# Patient Record
Sex: Female | Born: 1975 | Race: White | Hispanic: No | Smoking: Never smoker
Health system: Southern US, Community
[De-identification: ages and names within clinical notes are randomized; demographics above are authoritative.]

## PROBLEM LIST (undated history)

## (undated) DIAGNOSIS — M1990 Unspecified osteoarthritis, unspecified site: Secondary | ICD-10-CM

## (undated) DIAGNOSIS — Z87442 Personal history of urinary calculi: Secondary | ICD-10-CM

## (undated) DIAGNOSIS — N649 Disorder of breast, unspecified: Secondary | ICD-10-CM

## (undated) DIAGNOSIS — R202 Paresthesia of skin: Secondary | ICD-10-CM

## (undated) DIAGNOSIS — N200 Calculus of kidney: Secondary | ICD-10-CM

## (undated) DIAGNOSIS — R200 Anesthesia of skin: Secondary | ICD-10-CM

## (undated) DIAGNOSIS — C801 Malignant (primary) neoplasm, unspecified: Secondary | ICD-10-CM

## (undated) HISTORY — PX: ABDOMINAL HYSTERECTOMY: SHX81

## (undated) HISTORY — DX: Paresthesia of skin: R20.2

## (undated) HISTORY — DX: Anesthesia of skin: R20.0

## (undated) HISTORY — PX: SPINE SURGERY: SHX786

## (undated) HISTORY — PX: KNEE ARTHROSCOPY: SUR90

## (undated) HISTORY — PX: CHOLECYSTECTOMY: SHX55

## (undated) HISTORY — PX: TUBAL LIGATION: SHX77

---

## 1999-08-16 ENCOUNTER — Other Ambulatory Visit: Admission: RE | Admit: 1999-08-16 | Discharge: 1999-08-16 | Payer: Self-pay | Admitting: Gynecology

## 2000-08-28 ENCOUNTER — Other Ambulatory Visit: Admission: RE | Admit: 2000-08-28 | Discharge: 2000-08-28 | Payer: Self-pay | Admitting: Gynecology

## 2000-10-26 ENCOUNTER — Emergency Department (HOSPITAL_COMMUNITY): Admission: EM | Admit: 2000-10-26 | Discharge: 2000-10-26 | Payer: Self-pay | Admitting: Emergency Medicine

## 2000-10-29 ENCOUNTER — Inpatient Hospital Stay (HOSPITAL_COMMUNITY): Admission: EM | Admit: 2000-10-29 | Discharge: 2000-11-01 | Payer: Self-pay | Admitting: Internal Medicine

## 2000-10-29 ENCOUNTER — Encounter: Payer: Self-pay | Admitting: Internal Medicine

## 2001-08-12 ENCOUNTER — Other Ambulatory Visit: Admission: RE | Admit: 2001-08-12 | Discharge: 2001-08-12 | Payer: Self-pay | Admitting: Gynecology

## 2002-08-20 ENCOUNTER — Other Ambulatory Visit: Admission: RE | Admit: 2002-08-20 | Discharge: 2002-08-20 | Payer: Self-pay | Admitting: Gynecology

## 2002-09-02 ENCOUNTER — Encounter: Admission: RE | Admit: 2002-09-02 | Discharge: 2002-09-02 | Payer: Self-pay | Admitting: Internal Medicine

## 2002-09-02 ENCOUNTER — Encounter: Payer: Self-pay | Admitting: Internal Medicine

## 2002-09-09 ENCOUNTER — Emergency Department (HOSPITAL_COMMUNITY): Admission: EM | Admit: 2002-09-09 | Discharge: 2002-09-09 | Payer: Self-pay | Admitting: Emergency Medicine

## 2002-09-12 ENCOUNTER — Encounter: Payer: Self-pay | Admitting: General Surgery

## 2002-09-12 ENCOUNTER — Ambulatory Visit (HOSPITAL_COMMUNITY): Admission: RE | Admit: 2002-09-12 | Discharge: 2002-09-13 | Payer: Self-pay | Admitting: General Surgery

## 2002-09-12 ENCOUNTER — Encounter (INDEPENDENT_AMBULATORY_CARE_PROVIDER_SITE_OTHER): Payer: Self-pay | Admitting: *Deleted

## 2003-07-02 ENCOUNTER — Ambulatory Visit (HOSPITAL_COMMUNITY): Admission: RE | Admit: 2003-07-02 | Discharge: 2003-07-02 | Payer: Self-pay | Admitting: Family Medicine

## 2003-07-03 ENCOUNTER — Ambulatory Visit: Admission: RE | Admit: 2003-07-03 | Discharge: 2003-07-03 | Payer: Self-pay | Admitting: Family Medicine

## 2004-02-24 ENCOUNTER — Other Ambulatory Visit: Admission: RE | Admit: 2004-02-24 | Discharge: 2004-02-24 | Payer: Self-pay | Admitting: Gynecology

## 2004-07-24 ENCOUNTER — Emergency Department (HOSPITAL_COMMUNITY): Admission: EM | Admit: 2004-07-24 | Discharge: 2004-07-24 | Payer: Self-pay | Admitting: Emergency Medicine

## 2004-11-03 ENCOUNTER — Ambulatory Visit (HOSPITAL_COMMUNITY): Admission: RE | Admit: 2004-11-03 | Discharge: 2004-11-04 | Payer: Self-pay | Admitting: Orthopedic Surgery

## 2005-10-21 ENCOUNTER — Emergency Department (HOSPITAL_COMMUNITY): Admission: EM | Admit: 2005-10-21 | Discharge: 2005-10-22 | Payer: Self-pay | Admitting: Emergency Medicine

## 2006-01-07 ENCOUNTER — Emergency Department (HOSPITAL_COMMUNITY): Admission: EM | Admit: 2006-01-07 | Discharge: 2006-01-07 | Payer: Self-pay | Admitting: Emergency Medicine

## 2006-02-21 ENCOUNTER — Other Ambulatory Visit: Admission: RE | Admit: 2006-02-21 | Discharge: 2006-02-21 | Payer: Self-pay | Admitting: Gynecology

## 2006-04-06 ENCOUNTER — Ambulatory Visit (HOSPITAL_BASED_OUTPATIENT_CLINIC_OR_DEPARTMENT_OTHER): Admission: RE | Admit: 2006-04-06 | Discharge: 2006-04-06 | Payer: Self-pay | Admitting: Orthopedic Surgery

## 2007-05-19 ENCOUNTER — Emergency Department (HOSPITAL_COMMUNITY): Admission: EM | Admit: 2007-05-19 | Discharge: 2007-05-19 | Payer: Self-pay | Admitting: Emergency Medicine

## 2007-07-30 ENCOUNTER — Emergency Department (HOSPITAL_COMMUNITY): Admission: EM | Admit: 2007-07-30 | Discharge: 2007-07-30 | Payer: Self-pay | Admitting: Emergency Medicine

## 2008-01-01 ENCOUNTER — Other Ambulatory Visit: Admission: RE | Admit: 2008-01-01 | Discharge: 2008-01-01 | Payer: Self-pay | Admitting: Gynecology

## 2008-01-07 ENCOUNTER — Inpatient Hospital Stay (HOSPITAL_COMMUNITY): Admission: AD | Admit: 2008-01-07 | Discharge: 2008-01-07 | Payer: Self-pay | Admitting: Gynecology

## 2008-12-14 ENCOUNTER — Emergency Department (HOSPITAL_COMMUNITY): Admission: EM | Admit: 2008-12-14 | Discharge: 2008-12-14 | Payer: Self-pay | Admitting: Emergency Medicine

## 2010-04-12 LAB — URINALYSIS, ROUTINE W REFLEX MICROSCOPIC
Ketones, ur: NEGATIVE mg/dL
Nitrite: POSITIVE — AB
Protein, ur: 30 mg/dL — AB
Specific Gravity, Urine: 1.025 (ref 1.005–1.030)
Urobilinogen, UA: >8 mg/dL — ABNORMAL HIGH (ref 0.0–1.0)

## 2010-04-12 LAB — URINE MICROSCOPIC-ADD ON

## 2010-04-12 LAB — URINE CULTURE: Colony Count: >=100000

## 2010-05-09 ENCOUNTER — Emergency Department (HOSPITAL_COMMUNITY): Payer: 59

## 2010-05-09 ENCOUNTER — Emergency Department (HOSPITAL_COMMUNITY)
Admission: EM | Admit: 2010-05-09 | Discharge: 2010-05-09 | Disposition: A | Discharge status: Home / Self Care | Payer: 59 | Attending: Emergency Medicine | Admitting: Emergency Medicine

## 2010-05-09 DIAGNOSIS — B9689 Other specified bacterial agents as the cause of diseases classified elsewhere: Secondary | ICD-10-CM | POA: Insufficient documentation

## 2010-05-09 DIAGNOSIS — A499 Bacterial infection, unspecified: Secondary | ICD-10-CM | POA: Insufficient documentation

## 2010-05-09 DIAGNOSIS — N72 Inflammatory disease of cervix uteri: Secondary | ICD-10-CM | POA: Insufficient documentation

## 2010-05-09 DIAGNOSIS — N760 Acute vaginitis: Secondary | ICD-10-CM | POA: Insufficient documentation

## 2010-05-09 DIAGNOSIS — N390 Urinary tract infection, site not specified: Secondary | ICD-10-CM | POA: Insufficient documentation

## 2010-05-09 DIAGNOSIS — R1031 Right lower quadrant pain: Secondary | ICD-10-CM | POA: Insufficient documentation

## 2010-05-09 DIAGNOSIS — Z9089 Acquired absence of other organs: Secondary | ICD-10-CM | POA: Insufficient documentation

## 2010-05-09 LAB — DIFFERENTIAL
Basophils Absolute: 0 10*3/uL (ref 0.0–0.1)
Eosinophils Relative: 4 % (ref 0–5)
Lymphocytes Relative: 25 % (ref 12–46)
Monocytes Absolute: 0.5 10*3/uL (ref 0.1–1.0)
Neutro Abs: 6.8 10*3/uL (ref 1.7–7.7)

## 2010-05-09 LAB — CBC
HCT: 39 % (ref 36.0–46.0)
MCHC: 35.4 g/dL (ref 30.0–36.0)
MCV: 88.2 fL (ref 78.0–100.0)
RBC: 4.42 MIL/uL (ref 3.87–5.11)
RDW: 12.9 % (ref 11.5–15.5)

## 2010-05-09 LAB — COMPREHENSIVE METABOLIC PANEL
Albumin: 4.4 g/dL (ref 3.5–5.2)
Alkaline Phosphatase: 93 U/L (ref 39–117)
Calcium: 9.7 mg/dL (ref 8.4–10.5)
Chloride: 106 mEq/L (ref 96–112)
GFR calc Af Amer: >60 mL/min (ref >60)
GFR calc non Af Amer: >60 mL/min (ref >60)
Glucose, Bld: 89 mg/dL (ref 70–99)
Potassium: 3.5 mEq/L (ref 3.5–5.1)
Sodium: 139 mEq/L (ref 135–145)
Total Protein: 7.8 g/dL (ref 6.0–8.3)

## 2010-05-09 LAB — URINALYSIS, ROUTINE W REFLEX MICROSCOPIC
Nitrite: NEGATIVE
Specific Gravity, Urine: 1.027 (ref 1.005–1.030)
Urobilinogen, UA: 0.2 mg/dL (ref 0.0–1.0)

## 2010-05-09 LAB — PREGNANCY, URINE: Preg Test, Ur: NEGATIVE

## 2010-05-09 LAB — URINE MICROSCOPIC-ADD ON

## 2010-05-09 MED ORDER — IOHEXOL 300 MG/ML  SOLN
100.0000 mL | Freq: Once | INTRAMUSCULAR | Status: AC | PRN
  Administered 2010-05-09: 100 mL via INTRAVENOUS

## 2010-05-10 LAB — GC/CHLAMYDIA PROBE AMP, GENITAL: GC Probe Amp, Genital: NEGATIVE

## 2010-05-10 LAB — URINE CULTURE: Culture  Setup Time: 201205010126

## 2010-05-27 NOTE — Discharge Summary (Signed)
[facility]  Patient:    Claudia Mcintyre, Claudia Mcintyre Visit Number: [id number] MRN: [id number]          Service Type: MED Location: 3W 0357 02 Attending Physician:  Wilson Singer Dictated by:   Lilly Cove, M.D. Admit Date:  10/29/2000 Discharge Date: 11/01/2000                             Discharge Summary  FINAL DISCHARGE DIAGNOSES: 1. Right upper quadrant abdominal pain of unclear etiology. 2. Probable viral gastroenteritis.  CONDITION ON DISCHARGE:  Stable.  HISTORY OF PRESENT ILLNESS:  This 35 year old patient of mine was admitted with right upper quadrant abdominal pain associated with nausea and vomiting. Please see initial history and physical examination for initial evaluation.  HOSPITAL COURSE:  The patient had a HIDA scan while in the hospital and this was normal.  She was seen by the surgeon, Dr. Cyndia Bent, who felt that this was not a surgical cause of her pain and advised gastroenterology to see. Dr. Roosvelt Harps, gastroenterologist, saw the patient and he did upper GI endoscopy.  The upper GI endoscopy revealed mild gastritis but no other problems.  A CLOtest was negative.  During this time, she was given intravenous fluids and analgesics as well as antiemetics as needed.  She improved just with conservative treatment and was able to be discharged home on November 01, 2000, with no other additional recommendations.  She will call my office if she gets further problems. Dictated by:   Lilly Cove, M.D. Attending Physician:  Wilson Singer DD:  11/07/00 TD:  11/08/00 Job: [id number]

## 2010-05-27 NOTE — Op Note (Signed)
NAMEGRACELEE, STEMMLER           ACCOUNT NO.:  [id number]   MEDICAL RECORD NO.:  [id number]          PATIENT TYPE:  AMB   LOCATION:  DSC                          FACILITY:  [facility]   PHYSICIAN:  Deidre Ala, M.D.    DATE OF BIRTH:  02-10-1975   DATE OF PROCEDURE:  04/06/2006  DATE OF DISCHARGE:                               OPERATIVE REPORT   PREOPERATIVE DIAGNOSIS:  Locked right knee, rule out internal  derangement.   POSTOPERATIVE DIAGNOSES:  1. 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  2. Tight lateral retinaculum.  3. 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   PROCEDURES:  1. Right knee arthritis arthroscopy with medial lateral plica      excisions.  2. Ablation chondroplasty medial femoral condyle.  3. Lateral right retinacular release.   SURGEON:  1. Charlesetta Shanks, M.D.   ASSISTANT:  None.   ANESTHESIA:  General with LMA.   CULTURES:  None.   DRAINS:  None.   ESTIMATED BLOOD LOSS:  Minimal.   TOURNIQUET TIME:  22 minutes.   PATHOLOGIC FINDINGS AND HISTORY:  Claudia Mcintyre is a 35 year old female who  came in with an acute locked knee following a popping sound in her knee  with a twist.  Two years ago she had a similar scenario that was  refractory to conservative management and injections and therapy and  ultimately she was scoped with plica excisions and a lateral release.  This was exactly the same as before.  A great deal of pain in the  lateral gutter and inability to flex the knee.  At surgery we found huge  medial lateral plicas, a tight lateral retinaculum and my assumption is  that she subluxed her knee, hence the pop, she started pinching on the  lateral plica and this was causing her pain.  She absolutely would not  bend her knee.  The menisci were intact, both the ACL was intact.  There  was an area rubbing of the medial femoral condyle of plica,  chondromalacia grade II to III which we smoothed with the ablator on  one  as well as some trochlea and inferior pole patella changes.  There was  pouch synovitis.  There were no loose bodies.   PROCEDURE:  With adequate anesthesia obtained using LMA technique, 1  gram Ancef given IV prophylaxis, the patient was placed in the supine  position.  The right lower extremity was prepped from the malleoli to  the leg holder in the standard fashion.  After standard prepping and  draping, Esmarch examination was used the tourniquet was let up to 350  mmHg.  Superior lateral inflow portal was made, the knee was insufflated  with normal saline with the arthroscopic pump.  Medial and lateral scope  portals were then made and the joint was thoroughly inspected.  I then  shaved the medial plica back to the sidewall and lysed the fibrous  medial band.  I then cauterized bleeding points.  I then used the  ablator to smooth the medial femoral condyle defect.  I then checked and  probed the medial meniscus.  The  ACL and the lateral meniscus.  Portals  reversed and I shaved out the large lateral plica and observed tilt and  track as well as the softness of the medial patellar facette.  Pouch  synovitis was removed.  I then did an arthroscopic lateral retinacular  release with the ArthroCare hook from the vastus lateralis to the joint  line cauterizing geniculate vessels.  Improved tilt and track was noted.  The knee was irrigated through the scope, 0.5% Marcaine with morphine  was injected in and about the portals and the joint.  The portals left  open.  A bulky sterile compressive dressing was applied with lateral  foam pad for tamponade and easy wrap placed.  The patient then having  procedure well was awakened, taken to recovery room in satisfactory  condition to be discharged per outpatient routine, given Vicodin for  pain and told to call the office for appointment for recheck tomorrow.           ______________________________  V. Charlesetta Shanks, M.D.     VEP/MEDQ   D:  04/06/2006  T:  04/06/2006  Job:  [id number]

## 2010-05-27 NOTE — H&P (Signed)
[facility]  Patient:    Claudia Mcintyre, Claudia Mcintyre Visit Number: [id number] MRN: [id number]          Service Type: MED Location: 3W 0357 02 Attending Physician:  Wilson Singer Dictated by:   Lilly Cove, M.D. Admit Date:  10/29/2000                           History and Physical  HISTORY OF PRESENT ILLNESS:  This is a 35 year old patient of mine who has had right upper quadrant abdominal pain associated with nausea and vomiting and I have seen her in the office and ordered an ultrasound of the abdomen.  The ultrasound showed a contracted gallbladder and suggestion was made to do a HIDA scan.  Since this time she has had continued nausea and vomiting with the abdominal pain.  She has been to the emergency room twice.  She has not had much food intake.  ALLERGIES:  No known drug allergies.  MEDICATIONS:  No regular medications.  SOCIAL HISTORY:  She is a nonsmoker, does not have a history of alcohol abuse. She works at [employer].  FAMILY HISTORY:  Noncontributory.  REVIEW OF SYSTEMS:  Apart from the symptoms above, there are no other symptoms referable to the HEENT, constitutional, respiratory, cardiovascular, neurological, musculoskeletal, genitourinary systems.  PHYSICAL EXAMINATION:  VITAL SIGNS:  She is afebrile.  GENERAL:  She is hemodynamically stable.  She is not jaundiced.  CARDIOVASCULAR:  Heart sounds are present and normal with no murmurs or _________ .  LUNGS:  Lung fields are clear.  ABDOMEN:  Shows tenderness in the right upper quadrant.  The abdomen is soft and does not have rebound tenderness.  NEUROLOGIC:  She is alert and oriented with no focal neurologic signs.  INVESTIGATIONS:  White cell count is normal.  Alkaline phosphatase also is normal.  BUN and creatinine are also normal.  IMPRESSION/PLAN:  Right upper quadrant abdominal pain.  There seems to be clear gallbladder pathology and she is going to have a  HIDA scan.  Also we will get surgical consultations for further evaluation.  Further recommendations will depend on patients progress and will give intravenous fluids, keep her n.p.o., and give her intravenous antiemetics. Dictated by:   Lilly Cove, M.D. Attending Physician:  Wilson Singer DD:  10/29/00 TD:  10/29/00 Job: [id number]

## 2010-05-27 NOTE — Discharge Summary (Signed)
[name], [name] NO.:  [id number]   MEDICAL RECORD NO.:  [id number]          PATIENT TYPE:  INP   LOCATION:  5023                         FACILITY:  [facility]   PHYSICIAN:  Nelda Severe, MD      DATE OF BIRTH:  1975/10/19   DATE OF ADMISSION:  11/03/2004  DATE OF DISCHARGE:  11/04/2004                                 DISCHARGE SUMMARY   HOSPITAL COURSE:  This 35 year old woman was admitted for management of left  sciatic pain secondary to lateral stenosis.  She was taken the operating  room where a left-sided L4-5 lateral foraminotomy was carried out with  decompression of both L4-L5 nerve roots.  There were no complications.  This  morning, the morning after surgery, she has no complaints of leg pain.  She  has noticed a difference in her leg when she has been up to the bathroom.  Her dressing is dry.  She has good strength in both lower extremities on  manual resistance testing.   DISCHARGE MEDICATIONS:  At the present time, I have given her a prescription  for Vicodin (5/500) one to two q.6 h. p.r.n., 50 tablets, and she already  has some of this medication at home.   DISCHARGE INSTRUCTIONS:  She has been instructed to avoid bending and  lifting.  She may take a shower.  She will discontinue and dressing in 2  days' time.  Her physical therapy postoperatively is walking.  She is to  come see me in the office in 3 week's time.  She is to call me if there is a  problem with wound drainage or persistent fever.   FINAL DIAGNOSIS:  Lumbar spondylosis and stenosis, L4-5.   CONDITION ON DISCHARGE:  Ambulatory, wound stable, improved left leg pain.   FOLLOWUP ARRANGEMENTS AND INSTRUCTIONS:  See above.      Nelda Severe, MD  Electronically Signed     MT/MEDQ  D:  11/04/2004  T:  11/04/2004  Job:  [id number]

## 2010-05-27 NOTE — Op Note (Signed)
Claudia Mcintyre, Claudia Mcintyre                       ACCOUNT NO.:  [id number]   MEDICAL RECORD NO.:  [id number]                   PATIENT TYPE:  OIB   LOCATION:  5703                                 FACILITY:  [facility]   PHYSICIAN:  Gabrielle Dare. Janee Morn, M.D.             DATE OF BIRTH:  1975-01-31   DATE OF PROCEDURE:  09/12/2002  DATE OF DISCHARGE:                                 OPERATIVE REPORT   PREOPERATIVE DIAGNOSIS:  Symptomatic cholelithiasis.   POSTOPERATIVE DIAGNOSIS:  Symptomatic cholelithiasis.   PROCEDURE:  Laparoscopic cholecystectomy with intraoperative cholangiogram.   SURGEON:  Gabrielle Dare. Janee Morn, M.D.   ASSISTANT:  Jimmye Norman, M.D.   ANESTHESIA:  General.   HISTORY OF PRESENT ILLNESS:  The patient is a 35 year old white female who  was worked up for an acute-onset attack of right upper quadrant pain by her  primary care doctor, Wilson Singer, M.D.  Ultrasound demonstrated  cholelithiasis without acute cholecystitis.  The patient was scheduled for  elective procedure.  She had some more frequent attacks of biliary colic,  and her case was moved up sooner due to her symptoms.  She presents now for  elective cholecystectomy.   PROCEDURE IN DETAIL:  Informed consent was procured from the patient.  The  patient received preoperative antibiotics.  She was brought to the operating  room and general anesthesia was administered.  Her abdomen was prepped and  draped in a sterile fashion.  A curvilinear infraumbilical incision was  made, subcutaneous tissues were dissected down, revealing the anterior  fascia, which was divided sharply.  The peritoneal cavity was then entered  under direct vision without difficulty and a 0 Vicryl pursestring suture was  placed around the fascial opening.  The Hasson trocar was then inserted into  the abdomen and the abdomen was insufflated with carbon dioxide in the  standard fashion.  Under direct vision an 11 mm epigastric and two 5 mm   lateral ports were placed.  The dome of the gallbladder was retracted  superomedially.  Several omental adhesions were taken down using the Bovie  cautery revealing the infundibulum, which was retracted superior and  laterally.  Dissection was begun laterally and progressed medially, easily  revealing the cystic duct.  This was circumferentially dissected, creating a  nice large window between the cystic duct, the infundibulum of the  gallbladder, and the liver.  Once this was accomplished with satisfactory  visualization, a clip was placed on the infundibulum-cystic duct junction.  A small nick was made in the cystic duct and a Reddick cholangiogram  catheter was inserted.  Intraoperative cholangiogram was taken, which  demonstrated no filling defects in the common bile duct and a decent length  of cystic duct remaining.  The cholangiogram catheter was removed.  Three  clips were placed proximally on the cystic duct and it was divided.  Further  dissection revealed the cystic artery, which was encircled.  This was  clipped twice proximally, once distally, and divided.  The gallbladder was  then taken off the liver bed with careful further dissection with Bovie  cautery.  No other arterial branches were noted.  The cautery was used to  get good hemostasis on the liver bed and the gallbladder was placed in an  EndoCatch bag and taken out of the abdomen via the umbilical port site.  Once this was accomplished the abdomen was completely irrigated.  The liver  bed was rechecked and noted to be completely hemostatic and the irrigation  fluid was removed, and it was clear.  The ports were removed under direct  vision and the pneumoperitoneum was released.  The Hasson trocar was  removed.  The umbilical fascia was closed by tying a 0 Vicryl pursestring,  and all four wounds were copiously irrigated and closed with a running 4-0  Vicryl subcuticular stitch.  Marcaine 0.25% with epinephrine had been  used  at all port sites for local anesthetic.  Sponge, needle, and instrument  counts were correct.  Benzoin and Steri-Strips and sterile dressings were  applied.  The patient tolerated the procedure well without any apparent  complication and was taken to the recovery room in stable condition.                                               Gabrielle Dare Janee Morn, M.D.    BET/MEDQ  D:  09/12/2002  T:  09/13/2002  Job:  [id number]

## 2010-05-27 NOTE — Op Note (Signed)
NAMEPAISELY, BRICK NO.:  [id number]   MEDICAL RECORD NO.:  [id number]          PATIENT TYPE:  INP   LOCATION:  2899                         FACILITY:  [facility]   PHYSICIAN:  Nelda Severe, MD      DATE OF BIRTH:  1975-03-01   DATE OF PROCEDURE:  11/03/2004  DATE OF DISCHARGE:                                 OPERATIVE REPORT   SURGEON:  Nelda Severe, MD   ANESTHESIOLOGIST:  Odis Hollingshead, RN, PA-C   PREOPERATIVE DIAGNOSIS:  L4-5 lateral stenosis.   POSTOPERATIVE DIAGNOSIS:  L4-5 lateral stenosis.   OPERATIVE PROCEDURE:  1.  L4-5 lateral foraminotomy.  2.  Decompression of left-sided L4 and L5 nerve roots.   INDICATIONS FOR SURGERY:  Left leg pain.   DESCRIPTION OF PROCEDURE:  The patient was placed under general endotracheal  anesthesia.  A Foley catheter was placed in the bladder.  She was positioned  prone on the [place] spinal frame using the slings to support her legs so  that her lumbar spine would flexed.  Care was taken to position the upper  extremities so as to avoid hyperflexion and abduction of the shoulders,  hyperflexion of the elbows and any pressure whatsoever on the cubital  tunnels was avoided by use of foam padding from axilla to hands.   The lumbar area was prepped with DuraPrep and draped in rectangular fashion.  The drapes were secured with Ioban.   A short midline incision was made over what was perceived to be the L4-5  level.  The subcutaneous tissue and paraspinal fascia were injected with a  mixture of 0.25% Marcaine with epinephrine and 1% lidocaine with  epinephrine.  The paraspinal muscles were mobilized to the left side using  cutting current and Cobb elevators.  A cross-table lateral radiograph was  taken with the Kocher placed on what was initially perceived to be the L4  spinous process, which was proved to be the L5 spinous process.  The L4 and  L5 spinous process were extremely close together.  Another x-ray was  taken  with a Kocher on each of L4 and L5 spinous processes, confirming the level.   After the muscle had been mobilized to the lateral border of the facet  joint, a Taylor retractor was placed lateral to the apophyseal joint at L4-  5.   We then separated the ligamentum flavum from the medial edge of the facet  joint and the overhanging lamina above.  A high-speed bur was then used to  perform a medial facetectomy and to remove lamina proximally.  Ultimately,  the laminotomy was enlarged using small Kerrison rongeurs.  The ligamentum  flavum was detached and removed to expose the origins of the L5 nerve root.  The epidural veins extremely congested and bleeding was controlled with  bipolar coagulation.  Laminotomy was extended proximally and also a  foraminotomy at L4-5 carried out, removing with Kerrison rongeurs the upper  tip of the superior articular process.  A ball-tip probe could be admitted  to the L4-5 neural foramen and passed easily.  The L5 root was decompressed  to  the midpoint of the L5 pedicle and was free from pressure.   There was a small amount of epidural bleeding which could not be controlled  with bipolar coagulation.  Thrombin-soaked Gelfoam was placed in the  laminotomy for approximately 5 minutes and then removed, and there was no  more bleeding.  A small amount of bone bleeding was controlled with bone  wax.   We then injected more of the above-mentioned mixture of local into the  paraspinal fascia and also lateral to superior articular process of S1 and  L5 in order to attempt to anesthetize the medial branch the posterior  primary ramus.   The wound was then closed in layers using interrupted figure-of-eight #1  Vicryl suture in the fascia, 2-0 Vicryl suture in interrupted fashion in the  subcutaneous tissue and a subcuticular 3-0 undyed Vicryl for the skin.  The  skin incision was reinforced with Steri-Strips.  An antibiotic ointment  dressing was  applied and secured with OpSite.   The patient was then transferred to her bed.  She has not awakened  sufficiently to perform a neurologic examination, so none is recorded here.      Nelda Severe, MD  Electronically Signed     MT/MEDQ  D:  11/03/2004  T:  11/03/2004  Job:  [id number]

## 2010-05-27 NOTE — Consult Note (Signed)
[facility]  Patient:    Claudia Mcintyre, Claudia Mcintyre Visit Number: [id number] MRN: [id number]          Service Type: MED Location: 3W 0357 02 Attending Physician:  Wilson Singer Dictated by:   Currie Paris, M.D. Proc. Date: 10/29/00 Admit Date:  10/29/2000   CC:         Claudia Mcintyre, M.D.   Consultation Report  CHIEF COMPLAINT:  Right upper quadrant pain.  CLINICAL HISTORY:  I was asked by Dr. Karilyn Cota to see Claudia Mcintyre because of right upper quadrant pain. The patient relates that her pain began 4 or 5 days ago. It has been associated with nausea and intermittent vomiting as well as some diarrhea that lasted 3-4 days. She has felt warm but has not actually recorded a fever. She apparently has been in the emergency room twice and then was seen by Dr. Karilyn Cota and admitted for further evaluation today. She has had no relief of her symptoms but the pain primarily lists in the right upper quadrant. The patient has never had anything similar to this before.  ALLERGIES:  No known drug allergies.  MEDICATIONS:  None.  PAST SURGICAL HISTORY:  Tubal ligation.  SOCIAL HISTORY:  Smokes. No alcohol.  PHYSICAL EXAMINATION:  GENERAL:  The patient is a healthy, slightly uncomfortable female.  VITAL SIGNS:  Stable on admission.  HEENT:  Normocephalic. Eyes nonicteric. Pupils are equal, round, and reactive to light.  NECK:  Supple. No masses or thyromegaly.  LUNGS:  Clear to auscultation.  CARDIOVASCULAR:  Regular. No murmur, rub or gallop.  ABDOMEN:  Soft, but tender in the right upper quadrant, although not at all distended. There is an umbilical ring present. Bowel sounds are normal.  PELVIC: Not done.  EXTREMITIES:  No clubbing, cyanosis, or edema.  LABORATORY DATA:  Basically unremarkable. The white count and liver functions are normal. Her pulmonary ultrasound report in the chart on a written note is showing contracting gallbladder but  I do not have the full report. She just had a hepatobiliary scan which shows filling of the gallbladder and a 68% ejection fraction which is normal.  IMPRESSION:  Right upper quadrant pain of uncertain etiology, unlikely to be acute cholecystitis given the negative hepatobiliary scan and good ejection fraction. She does not really have a "surgical" abdomen.  RECOMMENDATION:  I think perhaps continued workup perhaps with a gastrointestinal consult for an endoscopy or even a colonoscopy since she has a fair amount of diarrhea as well, and we will continue to follow her. Dictated by:   Currie Paris, M.D. Attending Physician:  Wilson Singer DD:  10/29/00 TD:  10/30/00 Job: [id number]

## 2010-05-27 NOTE — Consult Note (Signed)
[facility]  Patient:    Claudia Mcintyre, Claudia Mcintyre Visit Number: [id number] MRN: [id number]          Service Type: MED Location: 3W [id number] Attending Physician:  Wilson Singer Dictated by:   Roosvelt Harps, M.D. Proc. Date: 10/30/00 Admit Date:  10/29/2000   CC:         Lilly Cove, M.D.   Consultation Report  DATE OF BIRTH:  [birth year]  REFERRING PHYSICIAN:  Dr. Lilly Cove.  REASON FOR CONSULTATION:  Claudia Mcintyre is a 35 year old female whom I am asked to see for a several-day episode of nausea, vomiting, right upper quadrant and epigastric pain associated with diarrhea.  She has not had any hematemesis or melena.  Her diarrhea recently has been watery-brown.  Epigastric pain has persisted.  She had an ultrasound done that showed a contracted gallbladder and for this reason, a HIDA scan was ordered, which was done with meal stimulation, and was found to be normal.  In hospital, she has been treated with IV fluids and promethazine but still complains of nausea and epigastric pain.  She apparently has had episodic alcohol overuse but denies any recently.  She denies dysphagia, weight loss or chronic gastrointestinal problems similar to the current situation.  PAST MEDICAL HISTORY:  Fairly unremarkable.  CURRENT MEDICATIONS:  None.  ALLERGIES:  None.  FAMILY HISTORY:  Noncontributory.  No family history of ulcers, gallstones, inflammatory bowel disease or gastrointestinal neoplasia.  SOCIAL HISTORY:  Nonsmoker.  History of episodic alcohol overuse.  Single.  REVIEW OF SYSTEMS:  GENERAL:  No weight loss or night sweats.  ENDOCRINE:  No history of diabetes or thyroid problems.  SKIN:  No rash or pruritus.  EYES: No icterus or change in vision.  ENT:  No aphthous ulcers or chronic sore throat.  RESPIRATORY:  No shortness of breath, cough or wheezing.  CARDIAC: No chest pain, palpitations or history of valvular heart disease.  GI:   As above.  GU:  No dysuria or hematuria.  Remainder of her review of systems is negative.  PHYSICAL EXAMINATION:  GENERAL:  On physical exam, she is a well-developed, mildly overweight young adult female in no acute distress, afebrile, with a blood pressure of 120/58, pulse is 66 and regular.  SKIN:  Normal.  HEENT:  Eyes are anicteric.  Oropharynx is unremarkable.  NECK:  Supple without thyromegaly or adenopathy.  NODES:  There is no cervical or inguinal adenopathy.  CHEST:  Clear.  HEART:  Heart sounds regular rate and rhythm.  ABDOMEN:  Soft with positive bowel sounds.  There is tenderness in the epigastrium and particularly in the right upper quadrant.  There is no mass or rebound.  RECTAL:  External hemorrhoidal tags but no internal lesions and minimal stool is guaiac negative.  EXTREMITIES:  Without cyanosis, clubbing, edema or rash.  LABORATORY TESTS:  Hemoglobin is 12.7, white blood count 5.6, platelets normal.  On liver function panel, her SGPT is 42 today but was normal on admission and all other liver function tests are normal.  Amylase and lipase are negative.  Urinalysis normal.  Blood cultures negative to date.  IMPRESSION:  [age]-year-old female who I suspect has a resolving gastroenteritis, however, peptic disease is a possibility.  PLAN:  Upper endoscopy will be performed to rule out significant peptic disease or gastritis.  Pending the results of this, if negative, she should just be observed on clear liquids.  The procedure of upper endoscopy is reviewed with the  patient in terms of technique, preparation and risks of complications, she agrees to proceed and it will be scheduled this afternoon. Please see the orders. Dictated by:   Roosvelt Harps, M.D. Attending Physician:  Wilson Singer DD:  10/30/00 TD:  10/31/00 Job: [id number]

## 2010-07-10 ENCOUNTER — Emergency Department (HOSPITAL_COMMUNITY)
Admission: EM | Admit: 2010-07-10 | Discharge: 2010-07-10 | Disposition: A | Discharge status: Home / Self Care | Payer: 59 | Attending: Emergency Medicine | Admitting: Emergency Medicine

## 2010-07-10 DIAGNOSIS — K089 Disorder of teeth and supporting structures, unspecified: Secondary | ICD-10-CM | POA: Insufficient documentation

## 2010-10-07 LAB — URINALYSIS, ROUTINE W REFLEX MICROSCOPIC: Specific Gravity, Urine: 1.025

## 2010-10-07 LAB — WET PREP, GENITAL
Trich, Wet Prep: NONE SEEN
Yeast Wet Prep HPF POC: NONE SEEN

## 2010-10-07 LAB — GC/CHLAMYDIA PROBE AMP, GENITAL: Chlamydia, DNA Probe: POSITIVE — AB

## 2010-10-07 LAB — DIFFERENTIAL
Basophils Absolute: 0
Basophils Relative: 1
Eosinophils Absolute: 0.1
Eosinophils Relative: 2
Monocytes Absolute: 0.4
Monocytes Relative: 5

## 2010-10-07 LAB — COMPREHENSIVE METABOLIC PANEL
ALT: 18
AST: 27
Albumin: 4.1
Alkaline Phosphatase: 76
BUN: 9
Chloride: 105
Potassium: 3.3 — ABNORMAL LOW
Sodium: 137
Total Bilirubin: 0.4
Total Protein: 7.3

## 2010-10-07 LAB — URINE MICROSCOPIC-ADD ON

## 2010-10-07 LAB — URINE CULTURE

## 2010-10-07 LAB — PREGNANCY, URINE: Preg Test, Ur: NEGATIVE

## 2010-10-07 LAB — CBC
HCT: 39
Platelets: 350
RDW: 15.4
WBC: 8.3

## 2011-04-29 ENCOUNTER — Emergency Department (HOSPITAL_COMMUNITY)
Admission: EM | Admit: 2011-04-29 | Discharge: 2011-04-29 | Disposition: A | Discharge status: Home / Self Care | Payer: 59 | Attending: Emergency Medicine | Admitting: Emergency Medicine

## 2011-04-29 ENCOUNTER — Encounter (HOSPITAL_COMMUNITY): Payer: Self-pay | Admitting: Emergency Medicine

## 2011-04-29 DIAGNOSIS — L0390 Cellulitis, unspecified: Secondary | ICD-10-CM

## 2011-04-29 DIAGNOSIS — R000 Tachycardia, unspecified: Secondary | ICD-10-CM | POA: Insufficient documentation

## 2011-04-29 DIAGNOSIS — L02818 Cutaneous abscess of other sites: Secondary | ICD-10-CM | POA: Insufficient documentation

## 2011-04-29 MED ORDER — IBUPROFEN 800 MG PO TABS: 800.0000 mg | ORAL_TABLET | Freq: Three times a day (TID) | ORAL | Status: AC

## 2011-04-29 MED ORDER — AMOXICILLIN-POT CLAVULANATE 875-125 MG PO TABS: 1.0000 | ORAL_TABLET | Freq: Two times a day (BID) | ORAL | Status: AC

## 2011-04-29 MED ORDER — CEFTRIAXONE SODIUM 1 G IJ SOLR
1.0000 g | Freq: Once | INTRAMUSCULAR | Status: AC
  Administered 2011-04-29: 1 g via INTRAMUSCULAR
  Filled 2011-04-29: qty 10

## 2011-04-29 MED ORDER — ONDANSETRON HCL 4 MG PO TABS
4.0000 mg | ORAL_TABLET | Freq: Once | ORAL | Status: AC
  Administered 2011-04-29: 4 mg via ORAL
  Filled 2011-04-29: qty 1

## 2011-04-29 MED ORDER — OXYCODONE-ACETAMINOPHEN 5-325 MG PO TABS: 1.0000 | ORAL_TABLET | ORAL | Status: AC | PRN

## 2011-04-29 MED ORDER — OXYCODONE-ACETAMINOPHEN 5-325 MG PO TABS
1.0000 | ORAL_TABLET | Freq: Once | ORAL | Status: AC
  Administered 2011-04-29: 1 via ORAL
  Filled 2011-04-29: qty 1

## 2011-04-29 MED ORDER — IBUPROFEN 800 MG PO TABS
800.0000 mg | ORAL_TABLET | Freq: Once | ORAL | Status: AC
  Administered 2011-04-29: 800 mg via ORAL
  Filled 2011-04-29: qty 1

## 2011-04-29 MED ORDER — DOXYCYCLINE HYCLATE 100 MG PO TABS
100.0000 mg | ORAL_TABLET | Freq: Once | ORAL | Status: AC
  Administered 2011-04-29: 100 mg via ORAL
  Filled 2011-04-29: qty 1

## 2011-04-29 MED ORDER — DOXYCYCLINE HYCLATE 100 MG PO CAPS: 100.0000 mg | ORAL_CAPSULE | Freq: Two times a day (BID) | ORAL | Status: AC

## 2011-04-29 NOTE — ED Provider Notes (Signed)
History     CSN: [id number]  Arrival date & time 04/29/11  1711   First MD Initiated Contact with Patient 04/29/11 1752      Chief Complaint  Patient presents with  . Cellulitis    (Consider location/radiation/quality/duration/timing/severity/associated sxs/prior treatment) HPI Comments: Pt states she has a small pimple behind the right ear a little over 1 week ago. She sleep with earrings in and the pimple was punctured. The wound has been getting progressively worse. She now has increase redness behind the right ear, extending to the lower angle of the jaw. No reported high fever, no n/v, and no chills. She does have some headache on the right side. The area is tender to touch. She has tried tylenol and motrin, and peroxide, but these are not helpful.   The history is provided by the patient.    History reviewed. No pertinent past medical history.  Past Surgical History  Procedure Date  . Cholecystectomy   . Tubal ligation     No family history on file.  History  Substance Use Topics  . Smoking status: Never Smoker   . Smokeless tobacco: Not on file  . Alcohol Use: No    OB History    Grav Para Term Preterm Abortions TAB SAB Ect Mult Living                  Review of Systems  Constitutional: Negative for activity change.       All ROS Neg except as noted in HPI  HENT: Negative for nosebleeds and neck pain.   Eyes: Negative for photophobia and discharge.  Respiratory: Negative for cough, shortness of breath and wheezing.   Cardiovascular: Negative for chest pain and palpitations.  Gastrointestinal: Negative for abdominal pain and blood in stool.  Genitourinary: Negative for dysuria, frequency and hematuria.  Musculoskeletal: Negative for back pain and arthralgias.  Skin: Positive for wound.  Neurological: Positive for headaches. Negative for dizziness, seizures and speech difficulty.  Psychiatric/Behavioral: Negative for hallucinations and confusion.     Allergies  Review of patient's allergies indicates no known allergies.  Home Medications  No current outpatient prescriptions on file.  BP 137/82  Pulse 112  Temp(Src) 98.4 F (36.9 C) (Oral)  Resp 18  Ht 5\' 3"  (1.6 m)  Wt 155 lb (70.308 kg)  BMI 27.46 kg/m2  SpO2 100%  LMP 04/21/2011  Physical Exam  Nursing note and vitals reviewed. Constitutional: She is oriented to person, place, and time. She appears well-developed and well-nourished.  Non-toxic appearance.  HENT:  Head: Normocephalic.  Right Ear: Tympanic membrane and external ear normal.  Left Ear: Tympanic membrane and external ear normal.       There is a red raised scabbed area behind the right ear. The area is tender to touch. The tenderness extends just below the angle of the right jaw. Increase redness of the site, but no streaking.  The TMs (right and left) are wnl. No ear drainage.  Eyes: EOM and lids are normal. Pupils are equal, round, and reactive to light.  Neck: Normal range of motion. Neck supple. Carotid bruit is not present. No tracheal deviation present.  Cardiovascular: Regular rhythm, normal heart sounds, intact distal pulses and normal pulses.  Tachycardia present.  Exam reveals no gallop and no friction rub.   No murmur heard. Pulmonary/Chest: Breath sounds normal. No respiratory distress.  Abdominal: Soft. Bowel sounds are normal. There is no tenderness. There is no guarding.  Musculoskeletal: Normal range of  motion.  Lymphadenopathy:       Head (right side): No submandibular adenopathy present.       Head (left side): No submandibular adenopathy present.    She has no cervical adenopathy.  Neurological: She is alert and oriented to person, place, and time. She has normal strength. No cranial nerve deficit or sensory deficit.  Skin: Skin is warm and dry.  Psychiatric: She has a normal mood and affect. Her speech is normal.    ED Course  Procedures (including critical care time)  Labs  Reviewed - No data to display No results found.   No diagnosis found.    MDM  I have reviewed nursing notes, vital signs, and all appropriate lab and imaging results for this patient. Pt has an abscess with early cellulitis behind the right ear. The pinna is not involved. The TM is wnl. No high fever, There is tachycardia. Pt is treated with Rocephin and Doxycycline today. Rx for Augmentin and Doxycycline given. Rx for ibuprofen and percocet given for pain and inflammation. Pt to return to the ED on 4/23 for recheck, sooner if any changes or problem.       Kathie Dike, [place] 04/29/11 [phone]

## 2011-04-29 NOTE — ED Notes (Signed)
Pt with ? abscess behind the right ear x 1 week

## 2011-04-29 NOTE — ED Notes (Signed)
Pt DC to home with steady gait 

## 2011-04-29 NOTE — Discharge Instructions (Signed)
Please apply warm compress behind the right ear for 10 to 15 min, three times daily. Augmentin and doxycycline 2 times daily with food until all taken. Ibuprofen three times daily after a meal. Percocet for pain if needed. This medication may cause drowsiness, use with caution. Please return on 4/23 for recheck of your infection. Return sooner if high fever or symptoms of worsening infection.Abscess An abscess (boil or furuncle) is an infected area under your skin. This area is filled with yellowish white fluid (pus). HOME CARE   Only take medicine as told by your doctor.   Keep the skin clean around your abscess. Keep clothes that may touch the abscess clean.   Change any bandages (dressings) as told by your doctor.   Avoid direct skin contact with other people. The infection can spread by skin contact with others.   Practice good hygiene and do not share personal care items.   Do not share athletic equipment, towels, or whirlpools. Shower after every practice or work out session.   If a draining area cannot be covered:   Do not play sports.   Children should not go to daycare until the wound has healed or until fluid (drainage) stops coming out of the wound.   See your doctor for a follow-up visit as told.  GET HELP RIGHT AWAY IF:   There is more pain, puffiness (swelling), and redness in the wound site.   There is fluid or bleeding from the wound site.   You have muscle aches, chills, fever, or feel sick.   You or your child has a temperature by mouth above 102 F (38.9 C), not controlled by medicine.   Your baby is older than 3 months with a rectal temperature of 102 F (38.9 C) or higher.  MAKE SURE YOU:   Understand these instructions.   Will watch your condition.   Will get help right away if you are not doing well or get worse.  Document Released: 06/14/2007 Document Revised: 12/15/2010 Document Reviewed: 06/14/2007 [facility] Patient Information 2012 [place],  [place].

## 2011-04-29 NOTE — ED Provider Notes (Signed)
Medical screening examination/treatment/procedure(s) were performed by non-physician practitioner and as supervising physician I was immediately available for consultation/collaboration.   Tafari Humiston L Jazyah Butsch, MD 04/29/11 2353 

## 2011-05-02 ENCOUNTER — Encounter (HOSPITAL_COMMUNITY): Payer: Self-pay | Admitting: *Deleted

## 2011-05-02 ENCOUNTER — Emergency Department (HOSPITAL_COMMUNITY)
Admission: EM | Admit: 2011-05-02 | Discharge: 2011-05-02 | Disposition: A | Discharge status: Home / Self Care | Payer: 59 | Attending: Emergency Medicine | Admitting: Emergency Medicine

## 2011-05-02 DIAGNOSIS — IMO0002 Reserved for concepts with insufficient information to code with codable children: Secondary | ICD-10-CM

## 2011-05-02 DIAGNOSIS — L02818 Cutaneous abscess of other sites: Secondary | ICD-10-CM | POA: Insufficient documentation

## 2011-05-02 DIAGNOSIS — L03818 Cellulitis of other sites: Secondary | ICD-10-CM | POA: Insufficient documentation

## 2011-05-02 MED ORDER — FLUCONAZOLE 150 MG PO TABS: 150.0000 mg | ORAL_TABLET | Freq: Once | ORAL | Status: AC

## 2011-05-02 NOTE — Discharge Instructions (Signed)
Abscess An abscess (boil or furuncle) is an infected area that contains a collection of pus.  SYMPTOMS Signs and symptoms of an abscess include pain, tenderness, redness, or hardness. You may feel a moveable soft area under your skin. An abscess can occur anywhere in the body.  TREATMENT  A surgical cut (incision) may be made over your abscess to drain the pus. Gauze may be packed into the space or a drain may be looped through the abscess cavity (pocket). This provides a drain that will allow the cavity to heal from the inside outwards. The abscess may be painful for a few days, but should feel much better if it was drained.  Your abscess, if seen early, may not have localized and may not have been drained. If not, another appointment may be required if it does not get better on its own or with medications. HOME CARE INSTRUCTIONS   Only take over-the-counter or prescription medicines for pain, discomfort, or fever as directed by your caregiver.   Take your antibiotics as directed if they were prescribed. Finish them even if you start to feel better.   Keep the skin and clothes clean around your abscess.   If the abscess was drained, you will need to use gauze dressing to collect any draining pus. Dressings will typically need to be changed 3 or more times a day.   The infection may spread by skin contact with others. Avoid skin contact as much as possible.   Practice good hygiene. This includes regular hand washing, cover any draining skin lesions, and do not share personal care items.   If you participate in sports, do not share athletic equipment, towels, whirlpools, or personal care items. Shower after every practice or tournament.   If a draining area cannot be adequately covered:   Do not participate in sports.   Children should not participate in day care until the wound has healed or drainage stops.   If your caregiver has given you a follow-up appointment, it is very important  to keep that appointment. Not keeping the appointment could result in a much worse infection, chronic or permanent injury, pain, and disability. If there is any problem keeping the appointment, you must call back to this facility for assistance.  SEEK MEDICAL CARE IF:   You develop increased pain, swelling, redness, drainage, or bleeding in the wound site.   You develop signs of generalized infection including muscle aches, chills, fever, or a general ill feeling.   You have an oral temperature above 102 F (38.9 C).  MAKE SURE YOU:   Understand these instructions.   Will watch your condition.   Will get help right away if you are not doing well or get worse.  Document Released: 10/05/2004 Document Revised: 12/15/2010 Document Reviewed: 07/30/2007 [facility] Patient Information 2012 [place], [place].  Finished both of your antibiotics as discussed.  Continue warm compresses.  You may try breaking your ibuprofen in half which may cause less stomach upset in nature you are taking this with food in her stomach.  You may take the Diflucan  prescription if needed for yeast vaginitis symptoms as discussed.  Your infection appears to be healing well.  Please get rechecked for any signs that this is trying to get worse again, but hopefully this will resolve completely without any difficulty.

## 2011-05-02 NOTE — ED Notes (Signed)
Pt here to get wound checked behind her right ear. Pt seen on Sunday.

## 2011-05-02 NOTE — ED Provider Notes (Signed)
History     CSN: [id number]  Arrival date & time 05/02/11  1744   First MD Initiated Contact with Patient 05/02/11 1757      Chief Complaint  Patient presents with  . Wound Check    (Consider location/radiation/quality/duration/timing/severity/associated sxs/prior treatment) HPI Comments: Claudia Mcintyre presents for a recheck of an infection behind her right ear for which she was seen here 3 days ago.  She was treated for cellulitis and abscess behind her right ear with this cellulitis extending to just below the angle of her right jaw.  Patient reports the redness has significantly improved and has less pain and swelling.  She is taking Augmentin and doxycycline twice daily without difficulty.  She has had no fevers or chills, no headaches and no drainage from the wound site.  She's using warm compresses at the site.  Her pain was 7/10 when originally seen, now 2-3/10 at the most.  Her main concern today is she is starting to have slight vaginal irritation and itching reminiscent of prior yeast infections.  Patient is a 36 y.o. female presenting with wound check.  Wound Check     History reviewed. No pertinent past medical history.  Past Surgical History  Procedure Date  . Cholecystectomy   . Tubal ligation     History reviewed. No pertinent family history.  History  Substance Use Topics  . Smoking status: Never Smoker   . Smokeless tobacco: Not on file  . Alcohol Use: No    OB History    Grav Para Term Preterm Abortions TAB SAB Ect Mult Living                  Review of Systems  Constitutional: Negative for fever.  HENT: Negative for congestion, sore throat and neck pain.   Eyes: Negative.   Respiratory: Negative for chest tightness and shortness of breath.   Cardiovascular: Negative for chest pain.  Gastrointestinal: Negative for nausea and abdominal pain.  Genitourinary: Negative.   Musculoskeletal: Negative for joint swelling and arthralgias.  Skin:  Positive for color change and wound. Negative for rash.  Neurological: Negative for dizziness, weakness, light-headedness, numbness and headaches.  Hematological: Negative.   Psychiatric/Behavioral: Negative.     Allergies  Review of patient's allergies indicates no known allergies.  Home Medications   Current Outpatient Rx  Name Route Sig Dispense Refill  . AMOXICILLIN-POT CLAVULANATE 875-125 MG PO TABS Oral Take 1 tablet by mouth 2 (two) times daily. 14 tablet 0    Please take with food  . DOXYCYCLINE HYCLATE 100 MG PO CAPS Oral Take 1 capsule (100 mg total) by mouth 2 (two) times daily. 14 capsule 0    Please take with food  . IBUPROFEN 800 MG PO TABS Oral Take 1 tablet (800 mg total) by mouth 3 (three) times daily. 21 tablet 0  . OXYCODONE-ACETAMINOPHEN 5-325 MG PO TABS Oral Take 1 tablet by mouth every 4 (four) hours as needed for pain. 24 tablet 0  . FLUCONAZOLE 150 MG PO TABS Oral Take 1 tablet (150 mg total) by mouth once. 1 tablet 0    BP 129/81  Pulse 95  Temp(Src) 97.9 F (36.6 C) (Oral)  Resp 18  Ht 5\' 3"  (1.6 m)  Wt 155 lb (70.308 kg)  BMI 27.46 kg/m2  SpO2 100%  LMP 04/21/2011  Physical Exam  Nursing note and vitals reviewed. Constitutional: She appears well-developed and well-nourished.  HENT:  Head: Normocephalic and atraumatic.  Eyes: Conjunctivae are  normal.  Neck: Normal range of motion. Neck supple.  Cardiovascular: Normal rate.   Pulmonary/Chest: Effort normal and breath sounds normal.  Lymphadenopathy:    She has no cervical adenopathy.  Neurological: She is alert.  Skin: Skin is warm and dry.       Lesion noted behind right ear over mastoid, 2 cm slightly raised erythematous lesion without fluctuance or significant induration.  No drainage.  No streaking noted and no surrounding cellulitis.  Psychiatric: She has a normal mood and affect.    ED Course  Procedures (including critical care time)  Labs Reviewed - No data to display No results  found.   1. Abscess or cellulitis of head or scalp       MDM  Infection which is responding appropriately with current antibiotic treatment.  Patient encouraged to finish the entire course of both antibiotics and to continue with warm compresses.  She is prescribed a Diflucan tablet to use as needed for yeast like symptoms which is probably due to her current antibiotic regimen.  Patient encouraged when necessary followup here or with her PCP if her symptoms do not completely resolve or if they start to regress.        Candis Musa, PA 05/02/11 [phone]

## 2011-05-06 NOTE — ED Provider Notes (Signed)
Medical screening examination/treatment/procedure(s) were performed by non-physician practitioner and as supervising physician I was immediately available for consultation/collaboration.  Shelda Jakes, MD 05/06/11 2102

## 2011-06-22 ENCOUNTER — Emergency Department (HOSPITAL_COMMUNITY)
Admission: EM | Admit: 2011-06-22 | Discharge: 2011-06-22 | Disposition: A | Discharge status: Home / Self Care | Payer: 59 | Attending: Emergency Medicine | Admitting: Emergency Medicine

## 2011-06-22 ENCOUNTER — Encounter (HOSPITAL_COMMUNITY): Payer: Self-pay

## 2011-06-22 DIAGNOSIS — R599 Enlarged lymph nodes, unspecified: Secondary | ICD-10-CM | POA: Insufficient documentation

## 2011-06-22 DIAGNOSIS — R590 Localized enlarged lymph nodes: Secondary | ICD-10-CM

## 2011-06-22 MED ORDER — DOXYCYCLINE HYCLATE 100 MG PO TABS
100.0000 mg | ORAL_TABLET | Freq: Once | ORAL | Status: AC
  Administered 2011-06-22: 100 mg via ORAL
  Filled 2011-06-22: qty 1

## 2011-06-22 NOTE — Discharge Instructions (Signed)
Lymphadenopathy Lymphadenopathy means "disease of the lymph glands." But the term is usually used to describe swollen or enlarged lymph glands, also called lymph nodes. These are the bean-shaped organs found in many locations including the neck, underarm, and groin. Lymph glands are part of the immune system, which fights infections in your body. Lymphadenopathy can occur in just one area of the body, such as the neck, or it can be generalized, with lymph node enlargement in several areas. The nodes found in the neck are the most common sites of lymphadenopathy. CAUSES  When your immune system responds to germs (such as viruses or bacteria ), infection-fighting cells and fluid build up. This causes the glands to grow in size. This is usually not something to worry about. Sometimes, the glands themselves can become infected and inflamed. This is called lymphadenitis. Enlarged lymph nodes can be caused by many diseases:  Bacterial disease, such as strep throat or a skin infection.   Viral disease, such as a common cold.   Other germs, such as lyme disease, tuberculosis, or sexually transmitted diseases.   Cancers, such as lymphoma (cancer of the lymphatic system) or leukemia (cancer of the white blood cells).   Inflammatory diseases such as lupus or rheumatoid arthritis.   Reactions to medications.  Many of the diseases above are rare, but important. This is why you should see your caregiver if you have lymphadenopathy. SYMPTOMS   Swollen, enlarged lumps in the neck, back of the head or other locations.   Tenderness.   Warmth or redness of the skin over the lymph nodes.   Fever.  DIAGNOSIS  Enlarged lymph nodes are often near the source of infection. They can help healthcare providers diagnose your illness. For instance:   Swollen lymph nodes around the jaw might be caused by an infection in the mouth.   Enlarged glands in the neck often signal a throat infection.   Lymph nodes that  are swollen in more than one area often indicate an illness caused by a virus.  Your caregiver most likely will know what is causing your lymphadenopathy after listening to your history and examining you. Blood tests, x-rays or other tests may be needed. If the cause of the enlarged lymph node cannot be found, and it does not go away by itself, then a biopsy may be needed. Your caregiver will discuss this with you. TREATMENT  Treatment for your enlarged lymph nodes will depend on the cause. Many times the nodes will shrink to normal size by themselves, with no treatment. Antibiotics or other medicines may be needed for infection. Only take over-the-counter or prescription medicines for pain, discomfort or fever as directed by your caregiver. HOME CARE INSTRUCTIONS  Swollen lymph glands usually return to normal when the underlying medical condition goes away. If they persist, contact your health-care provider. He/she might prescribe antibiotics or other treatments, depending on the diagnosis. Take any medications exactly as prescribed. Keep any follow-up appointments made to check on the condition of your enlarged nodes.  SEEK MEDICAL CARE IF:   Swelling lasts for more than two weeks.   You have symptoms such as weight loss, night sweats, fatigue or fever that does not go away.   The lymph nodes are hard, seem fixed to the skin or are growing rapidly.   Skin over the lymph nodes is red and inflamed. This could mean there is an infection.  SEEK IMMEDIATE MEDICAL CARE IF:   Fluid starts leaking from the area of the   enlarged lymph node.   You develop a fever of 102 F (38.9 C) or greater.   Severe pain develops (not necessarily at the site of a large lymph node).   You develop chest pain or shortness of breath.   You develop worsening abdominal pain.  MAKE SURE YOU:   Understand these instructions.   Will watch your condition.   Will get help right away if you are not doing well or get  worse.  Document Released: 10/05/2007 Document Revised: 12/15/2010 Document Reviewed: 10/05/2007 [facility] Patient Information 2012 [place], [place].   Take the antibiotic as directed.  Apply warm compresses several times daily.  Apply antibiotic ointment and bandaid twice daily after washing with soap and water.  Follow up with your PCP in about 10 days.

## 2011-06-22 NOTE — ED Notes (Signed)
Boil behind rt ear per pt

## 2011-06-22 NOTE — ED Provider Notes (Signed)
History     CSN: [id number]  Arrival date & time 06/22/11  1759   First MD Initiated Contact with Patient 06/22/11 1837      Chief Complaint  Patient presents with  . Recurrent Skin Infections    (Consider location/radiation/quality/duration/timing/severity/associated sxs/prior treatment) HPI Comments: Pt was here in April for an infection behind her R ear.  She was prescribed augmentin and doxycycline with near complete resolution.  Her earring poked the area and it has again become inflamed and she is concerned she is developing another infection.  The history is provided by the patient. No language interpreter was used.    History reviewed. No pertinent past medical history.  Past Surgical History  Procedure Date  . Cholecystectomy   . Tubal ligation     No family history on file.  History  Substance Use Topics  . Smoking status: Never Smoker   . Smokeless tobacco: Not on file  . Alcohol Use: No    OB History    Grav Para Term Preterm Abortions TAB SAB Ect Mult Living                  Review of Systems  Constitutional: Negative for fever and chills.  Skin: Positive for wound.  All other systems reviewed and are negative.    Allergies  Review of patient's allergies indicates no known allergies.  Home Medications   Current Outpatient Rx  Name Route Sig Dispense Refill  . IBUPROFEN 200 MG PO TABS Oral Take 800 mg by mouth as needed. For pain      BP 123/68  Pulse 88  Temp 98 F (36.7 C) (Oral)  Resp 20  Ht 5\' 3"  (1.6 m)  Wt 150 lb (68.04 kg)  BMI 26.57 kg/m2  SpO2 100%  LMP 06/10/2011  Physical Exam  Nursing note and vitals reviewed. Constitutional: She is oriented to person, place, and time. She appears well-developed and well-nourished. No distress.  HENT:  Head: Normocephalic and atraumatic.    Eyes: EOM are normal.  Neck: Normal range of motion.  Cardiovascular: Normal rate, regular rhythm and normal heart sounds.   Pulmonary/Chest:  Effort normal and breath sounds normal.  Abdominal: Soft. She exhibits no distension. There is no tenderness.  Musculoskeletal: Normal range of motion.  Neurological: She is alert and oriented to person, place, and time.  Skin: Skin is warm and dry.  Psychiatric: She has a normal mood and affect. Judgment normal.    ED Course  Procedures (including critical care time)  Labs Reviewed - No data to display No results found.   1. Postauricular lymphadenopathy       MDM   no apparent infection at present.  Area is tender and red and she has associated LA rx-doxycycline 100 mg, BID, 20 Warm compresses.  F/y with your PCP in about 10 days.        Worthy Rancher, PA 06/22/11 2002

## 2011-06-23 NOTE — ED Provider Notes (Signed)
Medical screening examination/treatment/procedure(s) were performed by non-physician practitioner and as supervising physician I was immediately available for consultation/collaboration.   Shae Hinnenkamp M Autry Droege, DO 06/23/11 1111 

## 2011-08-16 ENCOUNTER — Encounter (HOSPITAL_COMMUNITY): Payer: Self-pay | Admitting: *Deleted

## 2011-08-16 ENCOUNTER — Emergency Department (HOSPITAL_COMMUNITY): Payer: 59

## 2011-08-16 ENCOUNTER — Emergency Department (HOSPITAL_COMMUNITY)
Admission: EM | Admit: 2011-08-16 | Discharge: 2011-08-16 | Disposition: A | Discharge status: Home / Self Care | Payer: 59 | Attending: Emergency Medicine | Admitting: Emergency Medicine

## 2011-08-16 DIAGNOSIS — R109 Unspecified abdominal pain: Secondary | ICD-10-CM | POA: Insufficient documentation

## 2011-08-16 DIAGNOSIS — R112 Nausea with vomiting, unspecified: Secondary | ICD-10-CM | POA: Insufficient documentation

## 2011-08-16 DIAGNOSIS — Z9089 Acquired absence of other organs: Secondary | ICD-10-CM | POA: Insufficient documentation

## 2011-08-16 LAB — URINALYSIS, ROUTINE W REFLEX MICROSCOPIC
Bilirubin Urine: NEGATIVE
Glucose, UA: NEGATIVE mg/dL
Hgb urine dipstick: NEGATIVE
Ketones, ur: NEGATIVE mg/dL
Nitrite: NEGATIVE
Specific Gravity, Urine: 1.02 (ref 1.005–1.030)
pH: 6.5 (ref 5.0–8.0)

## 2011-08-16 LAB — CBC WITH DIFFERENTIAL/PLATELET
HCT: 39.6 % (ref 36.0–46.0)
Hemoglobin: 13.7 g/dL (ref 12.0–15.0)
Lymphocytes Relative: 9 % — ABNORMAL LOW (ref 12–46)
MCV: 91 fL (ref 78.0–100.0)
Monocytes Absolute: 0.3 10*3/uL (ref 0.1–1.0)
Monocytes Relative: 3 % (ref 3–12)
Neutro Abs: 8.1 10*3/uL — ABNORMAL HIGH (ref 1.7–7.7)
WBC: 9.3 10*3/uL (ref 4.0–10.5)

## 2011-08-16 LAB — URINE MICROSCOPIC-ADD ON

## 2011-08-16 LAB — COMPREHENSIVE METABOLIC PANEL
ALT: 7 U/L (ref 0–35)
Albumin: 3.9 g/dL (ref 3.5–5.2)
BUN: 12 mg/dL (ref 6–23)
Calcium: 9.5 mg/dL (ref 8.4–10.5)
GFR calc Af Amer: >90 mL/min (ref >90)
Glucose, Bld: 134 mg/dL — ABNORMAL HIGH (ref 70–99)
Sodium: 138 mEq/L (ref 135–145)
Total Protein: 7.1 g/dL (ref 6.0–8.3)

## 2011-08-16 MED ORDER — ONDANSETRON HCL 4 MG/2ML IJ SOLN
4.0000 mg | Freq: Once | INTRAMUSCULAR | Status: AC
  Administered 2011-08-16: 4 mg via INTRAVENOUS
  Filled 2011-08-16: qty 2

## 2011-08-16 MED ORDER — SODIUM CHLORIDE 0.9 % IV SOLN
INTRAVENOUS | Status: DC
  Administered 2011-08-16: 20:00:00 via INTRAVENOUS

## 2011-08-16 MED ORDER — FAMOTIDINE IN NACL 20-0.9 MG/50ML-% IV SOLN
20.0000 mg | Freq: Once | INTRAVENOUS | Status: AC
  Administered 2011-08-16: 20 mg via INTRAVENOUS
  Filled 2011-08-16: qty 50

## 2011-08-16 MED ORDER — MORPHINE SULFATE 2 MG/ML IJ SOLN
2.0000 mg | INTRAMUSCULAR | Status: DC | PRN
  Administered 2011-08-16: 2 mg via INTRAVENOUS
  Filled 2011-08-16: qty 1

## 2011-08-16 MED ORDER — POTASSIUM CHLORIDE 20 MEQ/15ML (10%) PO LIQD
40.0000 meq | Freq: Once | ORAL | Status: AC
  Administered 2011-08-16: 40 meq via ORAL
  Filled 2011-08-16: qty 30

## 2011-08-16 MED ORDER — HYDROMORPHONE HCL PF 1 MG/ML IJ SOLN
1.0000 mg | Freq: Once | INTRAMUSCULAR | Status: AC
  Administered 2011-08-16: 1 mg via INTRAVENOUS

## 2011-08-16 MED ORDER — POTASSIUM CHLORIDE 10 MEQ/100ML IV SOLN
10.0000 meq | Freq: Once | INTRAVENOUS | Status: AC
  Administered 2011-08-16: 10 meq via INTRAVENOUS
  Filled 2011-08-16: qty 100

## 2011-08-16 MED ORDER — HYDROMORPHONE HCL PF 1 MG/ML IJ SOLN
INTRAMUSCULAR | Status: AC
  Filled 2011-08-16: qty 1

## 2011-08-16 MED ORDER — IOHEXOL 300 MG/ML  SOLN
100.0000 mL | Freq: Once | INTRAMUSCULAR | Status: AC | PRN
  Administered 2011-08-16: 100 mL via INTRAVENOUS

## 2011-08-16 MED ORDER — POTASSIUM CHLORIDE ER 10 MEQ PO TBCR: 10.0000 meq | EXTENDED_RELEASE_TABLET | Freq: Two times a day (BID) | ORAL | Status: DC

## 2011-08-16 MED ORDER — HYDROMORPHONE HCL PF 1 MG/ML IJ SOLN: 1.0000 mg | Freq: Once | INTRAMUSCULAR | Status: AC

## 2011-08-16 MED ORDER — HYDROCODONE-ACETAMINOPHEN 5-325 MG PO TABS
ORAL_TABLET | ORAL | Status: AC
Start: 2011-08-16 — End: 2011-08-26

## 2011-08-16 MED ORDER — HYDROMORPHONE HCL PF 1 MG/ML IJ SOLN
1.0000 mg | Freq: Once | INTRAMUSCULAR | Status: AC
  Administered 2011-08-16: 1 mg via INTRAVENOUS
  Filled 2011-08-16: qty 1

## 2011-08-16 MED ORDER — ONDANSETRON HCL 4 MG PO TABS: 4.0000 mg | ORAL_TABLET | Freq: Three times a day (TID) | ORAL | Status: AC | PRN

## 2011-08-16 MED ORDER — ONDANSETRON HCL 4 MG/2ML IJ SOLN
4.0000 mg | INTRAMUSCULAR | Status: AC | PRN
  Administered 2011-08-16 (×2): 4 mg via INTRAVENOUS
  Filled 2011-08-16 (×2): qty 2

## 2011-08-16 NOTE — ED Notes (Signed)
Pt tolerating moderate amounts of PO fluids without nausea or emesis.

## 2011-08-16 NOTE — ED Notes (Signed)
Generalized abdominal pain began yesterday at 1700. Vomiting began this morning. Denies diarrhea. Last normal BM was last night.

## 2011-08-16 NOTE — ED Provider Notes (Signed)
History     CSN: [id number]  Arrival date & time 08/16/11  1243   First MD Initiated Contact with Patient 08/16/11 1328      Chief Complaint  Patient presents with  . Abdominal Pain  . Emesis     HPI Pt was seen at 1405.  Per pt, c/o gradual onset and persistence of constant generalized abd "pain" since yesterday.  Has been associated with multiple intermittent episodes of N/V.  Describes the abd pain as "cramping."  Denies diarrhea, no fevers, no back pain, no rash, no black or blood in stools or emesis, no CP/SOB.      History reviewed. No pertinent past medical history.  Past Surgical History  Procedure Date  . Cholecystectomy   . Tubal ligation     History  Substance Use Topics  . Smoking status: Never Smoker   . Smokeless tobacco: Not on file  . Alcohol Use: No    Review of Systems ROS: Statement: All systems negative except as marked or noted in the HPI; Constitutional: Negative for fever and chills. ; ; Eyes: Negative for eye pain, redness and discharge. ; ; ENMT: Negative for ear pain, hoarseness, nasal congestion, sinus pressure and sore throat. ; ; Cardiovascular: Negative for chest pain, palpitations, diaphoresis, dyspnea and peripheral edema. ; ; Respiratory: Negative for cough, wheezing and stridor. ; ; Gastrointestinal: +N/V, abd pain. Negative for diarrhea, blood in stool, hematemesis, jaundice and rectal bleeding. . ; ; Genitourinary: Negative for dysuria, flank pain and hematuria. ; ; Musculoskeletal: Negative for back pain and neck pain. Negative for swelling and trauma.; ; Skin: Negative for pruritus, rash, abrasions, blisters, bruising and skin lesion.; ; Neuro: Negative for headache, lightheadedness and neck stiffness. Negative for weakness, altered level of consciousness , altered mental status, extremity weakness, paresthesias, involuntary movement, seizure and syncope.     Allergies  Review of patient's allergies indicates no known allergies.  Home  Medications  No current outpatient prescriptions on file.  BP 113/69  Pulse 96  Temp 98.3 F (36.8 C) (Oral)  Resp 18  Ht 5\' 3"  (1.6 m)  Wt 155 lb (70.308 kg)  BMI 27.46 kg/m2  SpO2 97%  LMP 07/30/2011  Physical Exam 1410: Physical examination:  Nursing notes reviewed; Vital signs and O2 SAT reviewed;  Constitutional: Well developed, Well nourished, Well hydrated, In no acute distress; Head:  Normocephalic, atraumatic; Eyes: EOMI, PERRL, No scleral icterus; ENMT: Mouth and pharynx normal, Mucous membranes moist; Neck: Supple, Full range of motion, No lymphadenopathy; Cardiovascular: Regular rate and rhythm, No gallop; Respiratory: Breath sounds clear & equal bilaterally, No wheezes.  Speaking full sentences with ease, Normal respiratory effort/excursion; Chest: Nontender, Movement normal; Abdomen: Soft, +mild diffuse tenderness to palp.  No rebound or guarding. Nondistended, Normal bowel sounds; Genitourinary: No CVA tenderness; Extremities: Pulses normal, No tenderness, No edema, No calf edema or asymmetry.; Neuro: AA&Ox3, Major CN grossly intact.  Speech clear. No gross focal motor or sensory deficits in extremities.; Skin: Color normal, Warm, Dry.   ED Course  Procedures    MDM  MDM Reviewed: nursing note and vitals Interpretation: labs, x-ray and CT scan     Results for orders placed during the hospital encounter of 08/16/11  URINALYSIS, ROUTINE W REFLEX MICROSCOPIC      Component Value Range   Color, Urine YELLOW  YELLOW   APPearance CLEAR  CLEAR   Specific Gravity, Urine 1.020  1.005 - 1.030   pH 6.5  5.0 - 8.0  Glucose, UA NEGATIVE  NEGATIVE mg/dL   Hgb urine dipstick NEGATIVE  NEGATIVE   Bilirubin Urine NEGATIVE  NEGATIVE   Ketones, ur NEGATIVE  NEGATIVE mg/dL   Protein, ur NEGATIVE  NEGATIVE mg/dL   Urobilinogen, UA 0.2  0.0 - 1.0 mg/dL   Nitrite NEGATIVE  NEGATIVE   Leukocytes, UA SMALL (*) NEGATIVE  PREGNANCY, URINE      Component Value Range   Preg Test, Ur  NEGATIVE  NEGATIVE  URINE MICROSCOPIC-ADD ON      Component Value Range   Squamous Epithelial / LPF MANY (*) RARE   WBC, UA 7-10  <3 WBC/hpf   RBC / HPF 0-2  <3 RBC/hpf   Bacteria, UA MANY (*) RARE  COMPREHENSIVE METABOLIC PANEL      Component Value Range   Sodium 138  135 - 145 mEq/L   Potassium 2.9 (*) 3.5 - 5.1 mEq/L   Chloride 103  96 - 112 mEq/L   CO2 22  19 - 32 mEq/L   Glucose, Bld 134 (*) 70 - 99 mg/dL   BUN 12  6 - 23 mg/dL   Creatinine, Ser 9.60  0.50 - 1.10 mg/dL   Calcium 9.5  8.4 - 45.4 mg/dL   Total Protein 7.1  6.0 - 8.3 g/dL   Albumin 3.9  3.5 - 5.2 g/dL   AST 13  0 - 37 U/L   ALT 7  0 - 35 U/L   Alkaline Phosphatase 86  39 - 117 U/L   Total Bilirubin 0.7  0.3 - 1.2 mg/dL   GFR calc non Af Amer 88 (*) >90 mL/min   GFR calc Af Amer >90  >90 mL/min  CBC WITH DIFFERENTIAL      Component Value Range   WBC 9.3  4.0 - 10.5 K/uL   RBC 4.35  3.87 - 5.11 MIL/uL   Hemoglobin 13.7  12.0 - 15.0 g/dL   HCT 09.8  11.9 - 14.7 %   MCV 91.0  78.0 - 100.0 fL   MCH 31.5  26.0 - 34.0 pg   MCHC 34.6  30.0 - 36.0 g/dL   RDW 82.9  56.2 - 13.0 %   Platelets 262  150 - 400 K/uL   Neutrophils Relative 88 (*) 43 - 77 %   Neutro Abs 8.1 (*) 1.7 - 7.7 K/uL   Lymphocytes Relative 9 (*) 12 - 46 %   Lymphs Abs 0.8  0.7 - 4.0 K/uL   Monocytes Relative 3  3 - 12 %   Monocytes Absolute 0.3  0.1 - 1.0 K/uL   Eosinophils Relative 0  0 - 5 %   Eosinophils Absolute 0.0  0.0 - 0.7 K/uL   Basophils Relative 0  0 - 1 %   Basophils Absolute 0.0  0.0 - 0.1 K/uL  LIPASE, BLOOD      Component Value Range   Lipase 22  11 - 59 U/L   Dg Abd Acute W/chest 08/16/2011  *RADIOLOGY REPORT*  Clinical Data: Abdomen pain.  ACUTE ABDOMEN SERIES (ABDOMEN 2 VIEW & CHEST 1 VIEW)  Comparison: CT abdomen pelvis May 09, 2010  Findings: Single frontal chest x-ray, supine and upright abdominal x-rays are submitted.  There is no focal infiltrate, pulmonary edema, or pleural effusion.  There is no free air.  There  are several air-filled dilated small bowel loops.  Air and bowel content are identified throughout colon.  The patient is status post prior cholecystectomy with postsurgical clips in the right upper  quadrant.  IMPRESSION: No acute cardiopulmonary disease identified.  No free air. Constipation.  Mild air-filled dilated small bowel loops may be due to small ileus or partial small bowel obstruction.  Follow-up is recommended.  Original Report Authenticated By: Sherian Rein, M.D.   Ct Abdomen Pelvis W Contrast 08/16/2011  *RADIOLOGY REPORT*  Clinical Data: Abdominal pain, vomiting, history tubal ligation  CT ABDOMEN AND PELVIS WITH CONTRAST  Technique:  Multidetector CT imaging of the abdomen and pelvis was performed following the standard protocol during bolus administration of intravenous contrast.  Contrast: OMNIPAQUE IOHEXOL 300 MG/ML  SOLN  Comparison: CT 05/09/2010  Findings: Lung bases are clear.  No pericardial fluid.  No focal hepatic lesion.  Cholecystectomy clips in the gallbladder fossa. There is mild dilatation of the common bile duct 9 mm and mild intrahepatic biliary duct dilatation.  This likely rate prior cholecystectomy.  The pancreas, spleen, adrenal glands, and kidneys are normal.  The stomach, small bowel, appendix, and cecum are normal.  Abdominal aorta normal caliber.  No retroperitoneal or periportal lymphadenopathy.  Small amount free fluid the pelvis.  Uterus is normal.  There is a enhancing round structure in the left ovary measuring 10 mm which likely represents a recently corpus luteal cyst.  No pelvic lymphadenopathy. Review of  bone windows demonstrates no aggressive osseous lesions.  IMPRESSION:  1.  Corpus luteal cyst within the left ovary consistent with recent ovulation. 2.  Normal appendix. 3. Prior cholecystectomy with mild dilatation of the common bile duct and hepatic ducts.  Bilirubin is normal therefore this is likely chronic dilatation related to cholecystectomy.   Original Report Authenticated By: Genevive Bi, M.D.    [place]:  Pt states she feels "better now" and wants to go home.  No acute findings on labs or CT scan to account for symptoms. Potassium repleted IV and PO.  Udip contaminated, pt denies urinary symptoms. No stooling while in the ED.  No N/V while in the ED.  Tol PO well.  Dx testing d/w pt.  Questions answered.  Verb understanding, agreeable to d/c home with outpt f/u.       Laray Anger, DO 08/19/11 1250

## 2011-11-30 ENCOUNTER — Ambulatory Visit (INDEPENDENT_AMBULATORY_CARE_PROVIDER_SITE_OTHER): Payer: 59 | Admitting: Family Medicine

## 2011-11-30 VITALS — BP 130/82 | HR 93 | Temp 98.1°F | Resp 16 | Ht 62.5 in | Wt 160.0 lb

## 2011-11-30 DIAGNOSIS — J069 Acute upper respiratory infection, unspecified: Secondary | ICD-10-CM

## 2011-11-30 DIAGNOSIS — J309 Allergic rhinitis, unspecified: Secondary | ICD-10-CM

## 2011-11-30 DIAGNOSIS — R51 Headache: Secondary | ICD-10-CM

## 2011-11-30 MED ORDER — AMOXICILLIN-POT CLAVULANATE 875-125 MG PO TABS: 1.0000 | ORAL_TABLET | Freq: Two times a day (BID) | ORAL | Status: DC

## 2011-11-30 MED ORDER — IPRATROPIUM BROMIDE 0.06 % NA SOLN: 2.0000 | Freq: Three times a day (TID) | NASAL | Status: DC

## 2011-11-30 NOTE — Patient Instructions (Signed)
Saline nasal spray atleast 4 times per day, over the counter mucinex or mucinex DM if needed for cough. drink plenty of fluids.  If your sinus headache is not improving in next few days - you can take augmentin as prescribed. Return to the clinic or go to the nearest emergency room if any of your symptoms worsen or new symptoms occur.   Upper Respiratory Infection, Adult An upper respiratory infection (URI) is also known as the common cold. It is often caused by a type of germ (virus). Colds are easily spread (contagious). You can pass it to others by kissing, coughing, sneezing, or drinking out of the same glass. Usually, you get better in 1 or 2 weeks.  HOME CARE   Only take medicine as told by your doctor.  Use a warm mist humidifier or breathe in steam from a hot shower.  Drink enough water and fluids to keep your pee (urine) clear or pale yellow.  Get plenty of rest.  Return to work when your temperature is back to normal or as told by your doctor. You may use a face mask and wash your hands to stop your cold from spreading. GET HELP RIGHT AWAY IF:   After the first few days, you feel you are getting worse.  You have questions about your medicine.  You have chills, shortness of breath, or brown or red spit (mucus).  You have yellow or brown snot (nasal discharge) or pain in the face, especially when you bend forward.  You have a fever, puffy (swollen) neck, pain when you swallow, or white spots in the back of your throat.  You have a bad headache, ear pain, sinus pain, or chest pain.  You have a high-pitched whistling sound when you breathe in and out (wheezing).  You have a lasting cough or cough up blood.  You have sore muscles or a stiff neck. MAKE SURE YOU:   Understand these instructions.  Will watch your condition.  Will get help right away if you are not doing well or get worse. Document Released: 06/14/2007 Document Revised: 03/20/2011 Document Reviewed:  05/02/2010 [facility] Patient Information 2013 [place], [place].   Allergic Rhinitis Allergic rhinitis is when the mucous membranes in the nose respond to allergens. Allergens are particles in the air that cause your body to have an allergic reaction. This causes you to release allergic antibodies. Through a chain of events, these eventually cause you to release histamine into the blood stream (hence the use of antihistamines). Although meant to be protective to the body, it is this release that causes your discomfort, such as frequent sneezing, congestion and an itchy runny nose.  CAUSES  The pollen allergens may come from grasses, trees, and weeds. This is seasonal allergic rhinitis, or "hay fever." Other allergens cause year-round allergic rhinitis (perennial allergic rhinitis) such as house dust mite allergen, pet dander and mold spores.  SYMPTOMS   Nasal stuffiness (congestion).  Runny, itchy nose with sneezing and tearing of the eyes.  There is often an itching of the mouth, eyes and ears. It cannot be cured, but it can be controlled with medications. DIAGNOSIS  If you are unable to determine the offending allergen, skin or blood testing may find it. TREATMENT   Avoid the allergen.  Medications and allergy shots (immunotherapy) can help.  Hay fever may often be treated with antihistamines in pill or nasal spray forms. Antihistamines block the effects of histamine. There are over-the-counter medicines that may help with nasal  congestion and swelling around the eyes. Check with your caregiver before taking or giving this medicine. If the treatment above does not work, there are many new medications your caregiver can prescribe. Stronger medications may be used if initial measures are ineffective. Desensitizing injections can be used if medications and avoidance fails. Desensitization is when a patient is given ongoing shots until the body becomes less sensitive to the allergen. Make sure  you follow up with your caregiver if problems continue. SEEK MEDICAL CARE IF:   You develop fever (more than 100.5 F (38.1 C).  You develop a cough that does not stop easily (persistent).  You have shortness of breath.  You start wheezing.  Symptoms interfere with normal daily activities. Document Released: 09/20/2000 Document Revised: 03/20/2011 Document Reviewed: 04/01/2008 [facility] Patient Information 2013 [place], [place].

## 2011-11-30 NOTE — Progress Notes (Signed)
Subjective:    Patient ID: Claudia Mcintyre, female    DOB: January 24, 1975, 36 y.o.   MRN: [id number]  HPI Claudia Mcintyre is a 36 y.o. female Frontal headache past 2 days, nasal congestion or runny nose - alternating, green nasal discharge. .  Off and on for months - but persistent for past 10 days.  More congested in cold weather. Slight occasional cough with lying down.  No other chest sx's, no dyspnea.  No fever. Hx of allergies. Trying otc tylenol sinus/cold.   Multiple sick contacts - uri sx's.   Review of Systems  Constitutional: Negative for fever and chills.  HENT: Positive for congestion and rhinorrhea.   Respiratory: Positive for cough. Negative for shortness of breath.   Cardiovascular: Negative for chest pain.  Neurological: Positive for headaches.       Objective:   Physical Exam  Vitals reviewed. Constitutional: She is oriented to person, place, and time. She appears well-developed and well-nourished. No distress.  HENT:  Head: Normocephalic and atraumatic.  Right Ear: Hearing, tympanic membrane, external ear and ear canal normal.  Left Ear: Hearing, tympanic membrane, external ear and ear canal normal.  Nose: Right sinus exhibits frontal sinus tenderness. Right sinus exhibits no maxillary sinus tenderness. Left sinus exhibits frontal sinus tenderness. Left sinus exhibits no maxillary sinus tenderness.  Mouth/Throat: Oropharynx is clear and moist. No oropharyngeal exudate.  Eyes: Conjunctivae normal and EOM are normal. Pupils are equal, round, and reactive to light.  Cardiovascular: Normal rate, regular rhythm, normal heart sounds and intact distal pulses.   No murmur heard. Pulmonary/Chest: Effort normal and breath sounds normal. No respiratory distress. She has no wheezes. She has no rhonchi.  Neurological: She is alert and oriented to person, place, and time.  Skin: Skin is warm and dry. No rash noted.  Psychiatric: She has a normal mood and affect. Her  behavior is normal.       Assessment & Plan:  Claudia Mcintyre is a 36 y.o. female 1. Sinus headache  ipratropium (ATROVENT) 0.06 % nasal spray, amoxicillin-clavulanate (AUGMENTIN) 875-125 MG per tablet  2. Allergic rhinitis  ipratropium (ATROVENT) 0.06 % nasal spray  3. URI (upper respiratory infection)     Likely recurrent allergic rhinitis, with concurrent URI now.  Frontal headache - possible early sinus disease. Start with symptomatic care below, then augmentin if not improving in few days.  rtc precautions.  Patient Instructions  Saline nasal spray atleast 4 times per day, over the counter mucinex or mucinex DM if needed for cough. drink plenty of fluids.  If your sinus headache is not improving in next few days - you can take augmentin as prescribed. Return to the clinic or go to the nearest emergency room if any of your symptoms worsen or new symptoms occur.   Upper Respiratory Infection, Adult An upper respiratory infection (URI) is also known as the common cold. It is often caused by a type of germ (virus). Colds are easily spread (contagious). You can pass it to others by kissing, coughing, sneezing, or drinking out of the same glass. Usually, you get better in 1 or 2 weeks.  HOME CARE   Only take medicine as told by your doctor.  Use a warm mist humidifier or breathe in steam from a hot shower.  Drink enough water and fluids to keep your pee (urine) clear or pale yellow.  Get plenty of rest.  Return to work when your temperature is back to normal or as  told by your doctor. You may use a face mask and wash your hands to stop your cold from spreading. GET HELP RIGHT AWAY IF:   After the first few days, you feel you are getting worse.  You have questions about your medicine.  You have chills, shortness of breath, or brown or red spit (mucus).  You have yellow or brown snot (nasal discharge) or pain in the face, especially when you bend forward.  You have a fever,  puffy (swollen) neck, pain when you swallow, or white spots in the back of your throat.  You have a bad headache, ear pain, sinus pain, or chest pain.  You have a high-pitched whistling sound when you breathe in and out (wheezing).  You have a lasting cough or cough up blood.  You have sore muscles or a stiff neck. MAKE SURE YOU:   Understand these instructions.  Will watch your condition.  Will get help right away if you are not doing well or get worse. Document Released: 06/14/2007 Document Revised: 03/20/2011 Document Reviewed: 05/02/2010 [facility] Patient Information 2013 [place], [place].   Allergic Rhinitis Allergic rhinitis is when the mucous membranes in the nose respond to allergens. Allergens are particles in the air that cause your body to have an allergic reaction. This causes you to release allergic antibodies. Through a chain of events, these eventually cause you to release histamine into the blood stream (hence the use of antihistamines). Although meant to be protective to the body, it is this release that causes your discomfort, such as frequent sneezing, congestion and an itchy runny nose.  CAUSES  The pollen allergens may come from grasses, trees, and weeds. This is seasonal allergic rhinitis, or "hay fever." Other allergens cause year-round allergic rhinitis (perennial allergic rhinitis) such as house dust mite allergen, pet dander and mold spores.  SYMPTOMS   Nasal stuffiness (congestion).  Runny, itchy nose with sneezing and tearing of the eyes.  There is often an itching of the mouth, eyes and ears. It cannot be cured, but it can be controlled with medications. DIAGNOSIS  If you are unable to determine the offending allergen, skin or blood testing may find it. TREATMENT   Avoid the allergen.  Medications and allergy shots (immunotherapy) can help.  Hay fever may often be treated with antihistamines in pill or nasal spray forms. Antihistamines block the  effects of histamine. There are over-the-counter medicines that may help with nasal congestion and swelling around the eyes. Check with your caregiver before taking or giving this medicine. If the treatment above does not work, there are many new medications your caregiver can prescribe. Stronger medications may be used if initial measures are ineffective. Desensitizing injections can be used if medications and avoidance fails. Desensitization is when a patient is given ongoing shots until the body becomes less sensitive to the allergen. Make sure you follow up with your caregiver if problems continue. SEEK MEDICAL CARE IF:   You develop fever (more than 100.5 F (38.1 C).  You develop a cough that does not stop easily (persistent).  You have shortness of breath.  You start wheezing.  Symptoms interfere with normal daily activities. Document Released: 09/20/2000 Document Revised: 03/20/2011 Document Reviewed: 04/01/2008 [facility] Patient Information 2013 [employer], [place].

## 2011-12-04 ENCOUNTER — Emergency Department (HOSPITAL_COMMUNITY)
Admission: EM | Admit: 2011-12-04 | Discharge: 2011-12-04 | Disposition: A | Discharge status: Home / Self Care | Payer: 59 | Attending: Emergency Medicine | Admitting: Emergency Medicine

## 2011-12-04 ENCOUNTER — Encounter (HOSPITAL_COMMUNITY): Payer: Self-pay

## 2011-12-04 DIAGNOSIS — R079 Chest pain, unspecified: Secondary | ICD-10-CM

## 2011-12-04 DIAGNOSIS — F430 Acute stress reaction: Secondary | ICD-10-CM | POA: Insufficient documentation

## 2011-12-04 DIAGNOSIS — F411 Generalized anxiety disorder: Secondary | ICD-10-CM | POA: Insufficient documentation

## 2011-12-04 DIAGNOSIS — Z566 Other physical and mental strain related to work: Secondary | ICD-10-CM

## 2011-12-04 DIAGNOSIS — F419 Anxiety disorder, unspecified: Secondary | ICD-10-CM

## 2011-12-04 DIAGNOSIS — Z79899 Other long term (current) drug therapy: Secondary | ICD-10-CM | POA: Insufficient documentation

## 2011-12-04 DIAGNOSIS — R0602 Shortness of breath: Secondary | ICD-10-CM

## 2011-12-04 LAB — TROPONIN I: Troponin I: <0.3 ng/mL (ref <0.30)

## 2011-12-04 LAB — BASIC METABOLIC PANEL
Calcium: 9.6 mg/dL (ref 8.4–10.5)
GFR calc non Af Amer: 71 mL/min — ABNORMAL LOW (ref >90)
Glucose, Bld: 79 mg/dL (ref 70–99)
Sodium: 137 mEq/L (ref 135–145)

## 2011-12-04 LAB — CBC
MCH: 30.7 pg (ref 26.0–34.0)
Platelets: 300 10*3/uL (ref 150–400)
RBC: 4.72 MIL/uL (ref 3.87–5.11)
WBC: 7.8 10*3/uL (ref 4.0–10.5)

## 2011-12-04 LAB — D-DIMER, QUANTITATIVE: D-Dimer, Quant: <0.27 ug/mL-FEU (ref 0.00–0.48)

## 2011-12-04 MED ORDER — LORAZEPAM 2 MG/ML IJ SOLN
1.0000 mg | Freq: Once | INTRAMUSCULAR | Status: AC
  Administered 2011-12-04: 1 mg via INTRAVENOUS
  Filled 2011-12-04: qty 1

## 2011-12-04 MED ORDER — KETOROLAC TROMETHAMINE 30 MG/ML IJ SOLN
15.0000 mg | Freq: Once | INTRAMUSCULAR | Status: AC
  Administered 2011-12-04: 15 mg via INTRAVENOUS
  Filled 2011-12-04: qty 1

## 2011-12-04 NOTE — ED Notes (Signed)
Pt reports has had burning and squeezing in chest radiating down left arm with nausea and SOB off and on for the past couple of weeks.  Says had a panic attack at work Thursday because of the pain.  Pt tearful now.

## 2011-12-04 NOTE — ED Notes (Signed)
MD at bedside. 

## 2011-12-04 NOTE — ED Provider Notes (Addendum)
History  This chart was scribed for Jones Skene, MD by Shari Heritage, ED Scribe. The patient was seen in room APA16A/APA16A. Patient's care was started at 0954.  CSN: [id number]  Arrival date & time 12/04/11  0940   First MD Initiated Contact with Patient 12/04/11 [phone]      Chief Complaint  Patient presents with  . Chest Pain     The history is provided by the patient. No language interpreter was used.    HPI Comments: Claudia Mcintyre is a 36 y.o. female who presents to the Emergency Department complaining of squeezing, constant, moderate to severe, left chest pain that radiates down left arm onset 3 hours ago. Pain is present at this time and patient rates as 10/10. Patient states that she has had this pain intermittently for the past few weeks. She also reports having had a panic attack last week at work during a pain episode. There is associated mild SOB, intermittent tingling and numbness in left arm and mild anxiety. Patient says that she experienced nausea last night, but is not nauseated now. Patient denies history of blood clots in heart or lungs. She does not take any female steroids or hormones. She has not taken any medications for pain relief today. First day of LMP was 11/10/2011. Patient does not smoke, drink alcohol or use recreational drugs. Patient says that a test a few years ago revealed high cholesterol. She has a surgical history of cholecystectomy, tubal ligation, knee arthroscopy and spine surgery.   Past Surgical History  Procedure Date  . Cholecystectomy   . Tubal ligation   . Spine surgery   . Knee arthroscopy     Family History  Problem Relation Age of Onset  . Heart disease Mother   . Hypertension Mother   . Heart disease Father   . Hypertension Father   . Heart disease Maternal Grandfather   . Heart disease Paternal Grandfather     History  Substance Use Topics  . Smoking status: Never Smoker   . Smokeless tobacco: Not on file  . Alcohol  Use: No    OB History    Grav Para Term Preterm Abortions TAB SAB Ect Mult Living                  Review of Systems At least 10pt or greater review of systems completed and are negative except where specified in the HPI.  Allergies  Review of patient's allergies indicates no known allergies.  Home Medications   Current Outpatient Rx  Name  Route  Sig  Dispense  Refill  . AMOXICILLIN-POT CLAVULANATE 875-125 MG PO TABS   Oral   Take 1 tablet by mouth 2 (two) times daily.   20 tablet   0   . IPRATROPIUM BROMIDE 0.06 % NA SOLN   Nasal   Place 2 sprays into the nose 3 (three) times daily.   15 mL   1     Triage Vitals: BP 141/86  Pulse 108  Temp 98.4 F (36.9 C) (Oral)  Resp 18  Ht 5\' 3"  (1.6 m)  Wt 160 lb (72.576 kg)  BMI 28.34 kg/m2  SpO2 100%  LMP 11/10/2011  Physical Exam  Nursing notes reviewed.  Electronic medical record reviewed. VITAL SIGNS:   Filed Vitals:   12/04/11 0947 12/04/11 1131 12/04/11 1310  BP: 141/86 119/68 117/65  Pulse: 108 84   Temp: 98.4 F (36.9 C)    TempSrc: Oral    Resp: 18  18 15  Height: 5\' 3"  (1.6 m)    Weight: 160 lb (72.576 kg)    SpO2: 100% 100% 100%   CONSTITUTIONAL: Awake, oriented, appears non-toxic HENT: Atraumatic, normocephalic, oral mucosa pink and moist, airway patent. Nares patent without drainage. External ears normal. EYES: Conjunctiva clear, EOMI, PERRLA NECK: Trachea midline, non-tender, supple CARDIOVASCULAR: Normal heart rate, Normal rhythm, No murmurs, rubs, gallops PULMONARY/CHEST: Clear to auscultation, no rhonchi, wheezes, or rales. Symmetrical breath sounds. Non-tender. ABDOMINAL: Non-distended, soft, non-tender - no rebound or guarding.  BS normal. NEUROLOGIC: Non-focal, moving all four extremities, no gross sensory or motor deficits. EXTREMITIES: No clubbing, cyanosis, or edema SKIN: Warm, Dry, No erythema, No rash  ED Course  Procedures (including critical care time)  Date: 12/04/2011  Rate:  88  Rhythm: normal sinus rhythm  QRS Axis: normal  Intervals: normal  ST/T Wave abnormalities: flattened t-waves in anterior leads - c/w prior ECG  Conduction Disutrbances: none  Narrative Interpretation: unremarkable - no change     DIAGNOSTIC STUDIES: Oxygen Saturation is 100% on room air, normal by my interpretation.    COORDINATION OF CARE: 10:35 AM- Patient informed of current plan for treatment and evaluation and agrees with plan at this time.   Results for orders placed during the hospital encounter of 12/04/11  TROPONIN I      Component Value Range   Troponin I <0.30  <0.30 ng/mL  BASIC METABOLIC PANEL      Component Value Range   Sodium 137  135 - 145 mEq/L   Potassium 3.5  3.5 - 5.1 mEq/L   Chloride 104  96 - 112 mEq/L   CO2 23  19 - 32 mEq/L   Glucose, Bld 79  70 - 99 mg/dL   BUN 11  6 - 23 mg/dL   Creatinine, Ser 1.61  0.50 - 1.10 mg/dL   Calcium 9.6  8.4 - 09.6 mg/dL   GFR calc non Af Amer 71 (*) >90 mL/min   GFR calc Af Amer 82 (*) >90 mL/min  CBC      Component Value Range   WBC 7.8  4.0 - 10.5 K/uL   RBC 4.72  3.87 - 5.11 MIL/uL   Hemoglobin 14.5  12.0 - 15.0 g/dL   HCT 04.5  40.9 - 81.1 %   MCV 87.1  78.0 - 100.0 fL   MCH 30.7  26.0 - 34.0 pg   MCHC 35.3  30.0 - 36.0 g/dL   RDW 91.4  78.2 - 95.6 %   Platelets 300  150 - 400 K/uL  D-DIMER, QUANTITATIVE      Component Value Range   D-Dimer, Quant <0.27  0.00 - 0.48 ug/mL-FEU   No results found.   1. Chest pain   2. Anxiety   3. Shortness of breath   4. Stress at work       MDM  Claudia Mcintyre is a 36 y.o. female presents with likely anxiety.  Checked cardiac markers d/t parent h/o cardiac disease at early age, nothing in the patient's personal history is concerning for an accelerated CAD history.  No SLE, hyperlipidemia, cocaine, Etc.  Pt has been having stress at work lately and thinks this may be causing her problems.  Pt received relief with small amount of Ativan. DDim negative, very  low risk with low pre-test prob.  Pt not suicidal.  Can f/u with PCP.  I explained the diagnosis and have given explicit precautions to return to the ER including worsening CP,  dyspnea, declining mental status or any other new or worsening symptoms. The patient understands and accepts the medical plan as it's been dictated and I have answered their questions. Discharge instructions concerning home care and prescriptions have been given.  The patient is STABLE and is discharged to home in good condition.  I personally performed the services described in this documentation, which was scribed in my presence. The recorded information has been reviewed and is accurate. Jones Skene, M.D.      Jones Skene, MD 12/04/11 2056  Jones Skene, MD 12/04/11 2057

## 2011-12-23 ENCOUNTER — Telehealth: Payer: Self-pay

## 2011-12-23 MED ORDER — FLUCONAZOLE 150 MG PO TABS: 150.0000 mg | ORAL_TABLET | Freq: Once | ORAL | Status: DC

## 2011-12-23 NOTE — Telephone Encounter (Signed)
Pt was given rx for antibiotics on her last visit, pt is having symptoms of yeast infection and would like to see if doctor can call in rx for treatment. [phone]

## 2011-12-23 NOTE — Telephone Encounter (Signed)
Diflucan sent to pharmacy.  If symptoms do not improve, pt needs to be evaluated.

## 2011-12-24 NOTE — Telephone Encounter (Signed)
Patient notified and voiced understanding.

## 2012-01-22 ENCOUNTER — Encounter (HOSPITAL_COMMUNITY): Payer: Self-pay | Admitting: *Deleted

## 2012-01-22 ENCOUNTER — Emergency Department (HOSPITAL_COMMUNITY)
Admission: EM | Admit: 2012-01-22 | Discharge: 2012-01-22 | Disposition: A | Discharge status: Home / Self Care | Payer: 59 | Attending: Emergency Medicine | Admitting: Emergency Medicine

## 2012-01-22 ENCOUNTER — Emergency Department (HOSPITAL_COMMUNITY): Payer: 59

## 2012-01-22 DIAGNOSIS — R509 Fever, unspecified: Secondary | ICD-10-CM | POA: Insufficient documentation

## 2012-01-22 DIAGNOSIS — R110 Nausea: Secondary | ICD-10-CM | POA: Insufficient documentation

## 2012-01-22 DIAGNOSIS — Z9089 Acquired absence of other organs: Secondary | ICD-10-CM | POA: Insufficient documentation

## 2012-01-22 DIAGNOSIS — R109 Unspecified abdominal pain: Secondary | ICD-10-CM | POA: Insufficient documentation

## 2012-01-22 DIAGNOSIS — R350 Frequency of micturition: Secondary | ICD-10-CM | POA: Insufficient documentation

## 2012-01-22 DIAGNOSIS — Z7982 Long term (current) use of aspirin: Secondary | ICD-10-CM | POA: Insufficient documentation

## 2012-01-22 DIAGNOSIS — R1084 Generalized abdominal pain: Secondary | ICD-10-CM | POA: Insufficient documentation

## 2012-01-22 DIAGNOSIS — Z79899 Other long term (current) drug therapy: Secondary | ICD-10-CM | POA: Insufficient documentation

## 2012-01-22 DIAGNOSIS — Z9889 Other specified postprocedural states: Secondary | ICD-10-CM | POA: Insufficient documentation

## 2012-01-22 DIAGNOSIS — M549 Dorsalgia, unspecified: Secondary | ICD-10-CM | POA: Insufficient documentation

## 2012-01-22 DIAGNOSIS — Z3202 Encounter for pregnancy test, result negative: Secondary | ICD-10-CM | POA: Insufficient documentation

## 2012-01-22 LAB — BASIC METABOLIC PANEL
Calcium: 9.4 mg/dL (ref 8.4–10.5)
GFR calc Af Amer: >90 mL/min (ref >90)
GFR calc non Af Amer: >90 mL/min (ref >90)
Sodium: 138 mEq/L (ref 135–145)

## 2012-01-22 LAB — CBC WITH DIFFERENTIAL/PLATELET
Basophils Absolute: 0 10*3/uL (ref 0.0–0.1)
Basophils Relative: 0 % (ref 0–1)
Eosinophils Absolute: 0.3 10*3/uL (ref 0.0–0.7)
Eosinophils Relative: 3 % (ref 0–5)
Lymphocytes Relative: 29 % (ref 12–46)
MCH: 31.1 pg (ref 26.0–34.0)
MCHC: 36.2 g/dL — ABNORMAL HIGH (ref 30.0–36.0)
MCV: 85.9 fL (ref 78.0–100.0)
Platelets: 260 10*3/uL (ref 150–400)
RDW: 12.6 % (ref 11.5–15.5)
WBC: 9.9 10*3/uL (ref 4.0–10.5)

## 2012-01-22 LAB — URINALYSIS, ROUTINE W REFLEX MICROSCOPIC
Bilirubin Urine: NEGATIVE
Glucose, UA: NEGATIVE mg/dL
Hgb urine dipstick: NEGATIVE
Ketones, ur: NEGATIVE mg/dL
Leukocytes, UA: NEGATIVE
Nitrite: NEGATIVE
Protein, ur: NEGATIVE mg/dL
Specific Gravity, Urine: 1.015 (ref 1.005–1.030)
Urobilinogen, UA: 0.2 mg/dL (ref 0.0–1.0)
pH: 6 (ref 5.0–8.0)

## 2012-01-22 MED ORDER — ONDANSETRON 4 MG PO TBDP
ORAL_TABLET | ORAL | Status: AC
  Administered 2012-01-22: 4 mg via ORAL
  Filled 2012-01-22: qty 1

## 2012-01-22 MED ORDER — OXYCODONE-ACETAMINOPHEN 5-325 MG PO TABS: 1.0000 | ORAL_TABLET | Freq: Four times a day (QID) | ORAL | Status: AC | PRN

## 2012-01-22 MED ORDER — KETOROLAC TROMETHAMINE 30 MG/ML IJ SOLN
30.0000 mg | Freq: Once | INTRAMUSCULAR | Status: AC
  Administered 2012-01-22: 30 mg via INTRAMUSCULAR
  Filled 2012-01-22: qty 1

## 2012-01-22 MED ORDER — ONDANSETRON 4 MG PO TBDP
4.0000 mg | ORAL_TABLET | Freq: Once | ORAL | Status: AC
  Administered 2012-01-22: 4 mg via ORAL

## 2012-01-22 MED ORDER — PROMETHAZINE HCL 25 MG PO TABS: 25.0000 mg | ORAL_TABLET | Freq: Four times a day (QID) | ORAL | Status: DC | PRN

## 2012-01-22 NOTE — ED Provider Notes (Signed)
History  This chart was scribed for Claudia Lennert, MD by Erskine Emery, ED Scribe. This patient was seen in room APA12/APA12 and the patient's care was started at 17:45.   CSN: [id number]  Arrival date & time 01/22/12  1634   First MD Initiated Contact with Patient 01/22/12 1745      Chief Complaint  Patient presents with  . Flank Pain  . Abdominal Pain    (Consider location/radiation/quality/duration/timing/severity/associated sxs/prior Treatment) Claudia Mcintyre is a 37 y.o. female who presents to the Emergency Department complaining of diffuse abdominal pain that radiates to the right back and flank since Friday. Pt reports some associated mild fever, chills, nausea, and frequency but denies any emesis or dysuria.  Patient is a 37 y.o. female presenting with abdominal pain. The history is provided by the patient. No language interpreter was used.  Abdominal Pain The primary symptoms of the illness include abdominal pain, fever and nausea. The primary symptoms of the illness do not include shortness of breath, vomiting, diarrhea or dysuria. The current episode started more than 2 days ago. The onset of the illness was gradual. The problem has been gradually worsening.  The patient states that she believes she is currently not pregnant. The patient has not had a change in bowel habit. Additional symptoms associated with the illness include chills, frequency and back pain. Symptoms associated with the illness do not include anorexia, constipation or hematuria. Significant associated medical issues do not include GERD, diabetes, sickle cell disease, substance abuse, diverticulitis, HIV or cardiac disease.   Dr. Chilton Si at [facility] on [address] in [place] is the pt's PCP.  History reviewed. No pertinent past medical history.  Past Surgical History  Procedure Date  . Cholecystectomy   . Tubal ligation   . Spine surgery   . Knee arthroscopy     Family History  Problem  Relation Age of Onset  . Heart disease Mother   . Hypertension Mother   . Heart disease Father   . Hypertension Father   . Heart disease Maternal Grandfather   . Heart disease Paternal Grandfather     History  Substance Use Topics  . Smoking status: Never Smoker   . Smokeless tobacco: Not on file  . Alcohol Use: No    OB History    Grav Para Term Preterm Abortions TAB SAB Ect Mult Living                  Review of Systems  Constitutional: Positive for fever and chills.  Respiratory: Negative for shortness of breath.   Gastrointestinal: Positive for nausea and abdominal pain. Negative for vomiting, diarrhea, constipation and anorexia.  Genitourinary: Positive for frequency. Negative for dysuria and hematuria.  Musculoskeletal: Positive for back pain.       Right flank pain  All other systems reviewed and are negative.    Allergies  Review of patient's allergies indicates no known allergies.  Home Medications   Current Outpatient Rx  Name  Route  Sig  Dispense  Refill  . ASPIRIN 81 MG PO CHEW   Oral   Chew 81 mg by mouth daily as needed. Pain         . SLEEP AID PO   Oral   Take 1 tablet by mouth daily.         Marland Kitchen FLUCONAZOLE 150 MG PO TABS   Oral   Take 1 tablet (150 mg total) by mouth once. Repeat if needed   2  tablet   0     Triage Vitals: BP 140/71  Pulse 93  Temp 98.8 F (37.1 C) (Oral)  Resp 16  Ht 5\' 3"  (1.6 m)  Wt 155 lb (70.308 kg)  BMI 27.46 kg/m2  SpO2 100%  LMP 01/05/2012  Physical Exam  Nursing note and vitals reviewed. Constitutional: She is oriented to person, place, and time. She appears well-developed.  HENT:  Head: Normocephalic and atraumatic.  Eyes: Conjunctivae normal and EOM are normal. No scleral icterus.  Neck: Neck supple. No thyromegaly present.  Cardiovascular: Normal rate and regular rhythm.  Exam reveals no gallop and no friction rub.   No murmur heard. Pulmonary/Chest: No stridor. She has no wheezes. She has no  rales. She exhibits no tenderness.  Abdominal: Soft. She exhibits no distension. There is tenderness. There is no rebound.       Mild suprapubic pain.  Musculoskeletal: Normal range of motion. She exhibits no edema.       Right flank pain  Lymphadenopathy:    She has no cervical adenopathy.  Neurological: She is oriented to person, place, and time. Coordination normal.  Skin: No rash noted. No erythema.  Psychiatric: She has a normal mood and affect. Her behavior is normal.    ED Course  Procedures (including critical care time) DIAGNOSTIC STUDIES: Oxygen Saturation is 100% on room air, normal by my interpretation.    COORDINATION OF CARE: 17:50--I evaluated the patient and we discussed a treatment plan including urinalysis to which the pt agreed.  Pt denies the need for pain medication.  19:04--I rechecked the pt who appears to be in more pain but states she is less nauseous after the medication. She now requests pain medicine. I explained that I think she may have a kidney stone and I need to do more tests, including blood work and x-ray.  21:29--I rechecked the pt and told her that I do not see a kidney stone. I instructed her to follow up with Dr. Chilton Si.  Results for orders placed during the hospital encounter of 01/22/12  URINALYSIS, ROUTINE W REFLEX MICROSCOPIC      Component Value Range   Color, Urine YELLOW  YELLOW   APPearance CLEAR  CLEAR   Specific Gravity, Urine 1.015  1.005 - 1.030   pH 6.0  5.0 - 8.0   Glucose, UA NEGATIVE  NEGATIVE mg/dL   Hgb urine dipstick NEGATIVE  NEGATIVE   Bilirubin Urine NEGATIVE  NEGATIVE   Ketones, ur NEGATIVE  NEGATIVE mg/dL   Protein, ur NEGATIVE  NEGATIVE mg/dL   Urobilinogen, UA 0.2  0.0 - 1.0 mg/dL   Nitrite NEGATIVE  NEGATIVE   Leukocytes, UA NEGATIVE  NEGATIVE     No diagnosis found.    MDM       The chart was scribed for me under my direct supervision.  I personally performed the history, physical, and medical  decision making and all procedures in the evaluation of this patient.Claudia Lennert, MD 01/22/12 2131

## 2012-01-22 NOTE — ED Notes (Signed)
Says pain is about a 4

## 2012-01-22 NOTE — ED Notes (Signed)
Pt states right flank pain and abdominal pain began Friday.

## 2012-01-24 LAB — URINE CULTURE

## 2012-05-22 ENCOUNTER — Ambulatory Visit (INDEPENDENT_AMBULATORY_CARE_PROVIDER_SITE_OTHER): Payer: 59 | Admitting: Obstetrics and Gynecology

## 2012-05-22 ENCOUNTER — Encounter: Payer: Self-pay | Admitting: Obstetrics and Gynecology

## 2012-05-22 VITALS — BP 130/90 | Ht 63.0 in | Wt 185.4 lb

## 2012-05-22 DIAGNOSIS — N760 Acute vaginitis: Secondary | ICD-10-CM

## 2012-05-22 DIAGNOSIS — B9689 Other specified bacterial agents as the cause of diseases classified elsewhere: Secondary | ICD-10-CM

## 2012-05-22 DIAGNOSIS — N949 Unspecified condition associated with female genital organs and menstrual cycle: Secondary | ICD-10-CM

## 2012-05-22 DIAGNOSIS — N939 Abnormal uterine and vaginal bleeding, unspecified: Secondary | ICD-10-CM

## 2012-05-22 DIAGNOSIS — N812 Incomplete uterovaginal prolapse: Secondary | ICD-10-CM | POA: Insufficient documentation

## 2012-05-22 DIAGNOSIS — A499 Bacterial infection, unspecified: Secondary | ICD-10-CM

## 2012-05-22 DIAGNOSIS — N946 Dysmenorrhea, unspecified: Secondary | ICD-10-CM

## 2012-05-22 DIAGNOSIS — N814 Uterovaginal prolapse, unspecified: Secondary | ICD-10-CM

## 2012-05-22 DIAGNOSIS — IMO0002 Reserved for concepts with insufficient information to code with codable children: Secondary | ICD-10-CM

## 2012-05-22 LAB — POCT WET PREP (WET MOUNT)

## 2012-05-22 MED ORDER — METRONIDAZOLE 500 MG PO TABS: 500.0000 mg | ORAL_TABLET | Freq: Two times a day (BID) | ORAL | Status: DC

## 2012-05-22 NOTE — Progress Notes (Signed)
Patient ID: Claudia Mcintyre, female   DOB: 03-26-1975, 37 y.o.   MRN: [id number] Pt here today for irregular periods. Pt states she has had heavy heavy periods for the past 6 months and they continue to get worse. She also states she has had some wt gain in the past few months. Having severe stabbing pain all the time, and severe cramping when on period.  descr pain as all the time since last March '13 BC--BTL Cigs none Lmp 12d ago, stll intermittent bleeding DYSPAREUNIA SEVERE DEEP THRUST Physical Examination: General appearance - alert, well appearing, and in no distress, chronically ill appearing and somber Abdomen - soft, nontender, nondistended, no masses or organomegaly Pelvic - normal external genitalia, vulva, vagina, cervix, uterus and adnexa, VULVA: normal appearing vulva with no masses, tenderness or lesions, laxity of introiitus , VAGINA: normal appearing vagina with normal color and discharge, no lesions, PELVIC FLOOR EXAM: no cystocele, rectocele or prolapse noted, rectocele 2, uterine descensus grade 2 , CERVIX: cervical discharge present - copious, grey and + bv  , UTERUS: uterus is normal size, shape, consistency and nontender, retroverted, enlarged to 8 week's size,  ADNEXA: normal adnexa in size, nontender and no masses, no masses,  WET MOUNT done - results: clue cells, no trich  Assess: BV             Ut retroversion             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

## 2012-05-22 NOTE — Addendum Note (Signed)
Addended by: [name] on: 05/22/2012 01:51 PM   Modules accepted: Orders

## 2012-05-22 NOTE — Patient Instructions (Addendum)
Prolapse  Prolapse means the falling down, bulging, dropping, or drooping of a body part. Organs that commonly prolapse include the rectum, small intestine, bladder, urethra, vagina (birth canal), uterus (womb), and cervix. Prolapse occurs when the ligaments and muscle tissue around the rectum, bladder, and uterus are damaged or weakened.  CAUSES  This happens especially with:  Childbirth. Some women feel pelvic pressure or have trouble holding their urine right after childbirth, because of stretching and tearing of pelvic tissues. This generally gets better with time and the feeling usually goes away, but it may return with aging.  Chronic heavy lifting.  Aging.  Menopause, with loss of estrogen production weakening the pelvic ligaments and muscles.  Past pelvic surgery.  Obesity.  Chronic constipation.  Chronic cough. Prolapse may affect a single organ, or several organs may prolapse at the same time. The front wall of the vagina holds up the bladder. The back wall holds up part of the lower intestine, or rectum. The uterus fills a spot in the middle. All these organs can be involved when the ligaments and muscles around the vagina relax too much. This often gets worse when women stop producing estrogen (menopause). SYMPTOMS  Uncontrolled loss of urine (incontinence) with cough, sneeze, straining, and exercise.  More force may be required to have a bowel movement, due to trapping of the stool.  When part of an organ bulges through the opening of the vagina, there is sometimes a feeling of heaviness or pressure. It may feel as though something is falling out. This sensation increases with coughing or bearing down.  If the organs protrude through the opening of the vagina and rub against the clothing, there may be soreness, ulcers, infection, pain, and bleeding.  Lower back pain.  Pushing in the upper or lower part of the vagina, to pass urine or have a bowel movement.  Problems  having sexual intercourse.  Being unable to insert a tampon or applicator. DIAGNOSIS  Usually, a physical exam is all that is needed to identify the problem. During the examination, you may be asked to cough and strain while lying down, sitting up, and standing up. Your caregiver will determine if more testing is required, such as bladder function tests. Some diagnoses are:  Cystocele: Bulging and falling of the bladder into the top of the vagina.  Rectocele: Part of the rectum bulging into the vagina.  Prolapse of the uterus: The uterus falls or drops into the vagina.  Enterocele: Bulging of the top of the vagina, after a hysterectomy (uterus removal), with the small intestine bulging into the vagina. A hernia in the top of the vagina.  Urethrocele: The urethra (urine carrying tube) bulging into the vagina. TREATMENT  In most cases, prolapse needs to be treated only if it produces symptoms. If the symptoms are interfering with your usual daily or sexual activities, treatment may be necessary. The following are some measures that may be used to treat prolapse.  Estrogen may help elderly women with mild prolapse.  Kegel exercises may help mild cases of prolapse, by strengthening and tightening the muscles of the pelvic floor.  Pessaries are used in women who choose not to, or are unable to, have surgery. A pessary is a doughnut-shaped piece of plastic or rubber that is put into the vagina to keep the organs in place. This device must be fitted by your caregiver. Your caregiver will also explain how to care for yourself with the pessary. If it works well for you,   to keep the organs in place. This device must be fitted by your caregiver. Your caregiver will also explain how to care for yourself with the pessary. If it works well for you, this may be the only treatment required.   Surgery is often the only form of treatment for more severe prolapses. There are different types of surgery available. You should discuss what the best procedure is for you. If the uterus is prolapsed, it may be removed (hysterectomy) as part of the surgical treatment. Your caregiver will  discuss the risks and benefits with you.   Uterine-vaginal suspension (surgery to hold up the organs) may be used, especially if you want to maintain your fertility.  No form of treatment is guaranteed to correct the prolapse or relieve the symptoms.  HOME CARE INSTRUCTIONS    Wear a sanitary pad or absorbent product if you have incontinence of urine.   Avoid heavy lifting and straining with exercise and work.   Take over-the-counter pain medicine for minor discomfort.   Try taking estrogen or using estrogen vaginal cream.   Try Kegel exercises or use a pessary, before deciding to have surgery.   Do Kegel exercises after having a baby.  SEEK MEDICAL CARE IF:    Your symptoms interfere with your daily activities.   You need medicine to help with the discomfort.   You need to be fitted with a pessary.   You notice bleeding from the vagina.   You think you have ulcers or you notice ulcers on the cervix.   You have an oral temperature above 102 F (38.9 C).   You develop pain or blood with urination.   You have bleeding with a bowel movement.   The symptoms are interfering with your sex life.   You have urinary incontinence that interferes with your daily activities.   You lose urine with sexual intercourse.   You have a chronic cough.   You have chronic constipation.  Document Released: 07/02/2002 Document Revised: 03/20/2011 Document Reviewed: 01/10/2009  [employer] Patient Information 2013 [employer]

## 2012-05-23 ENCOUNTER — Ambulatory Visit: Payer: 59 | Admitting: Obstetrics and Gynecology

## 2012-05-23 LAB — GC/CHLAMYDIA PROBE AMP: CT Probe RNA: NEGATIVE

## 2012-05-29 ENCOUNTER — Ambulatory Visit (INDEPENDENT_AMBULATORY_CARE_PROVIDER_SITE_OTHER): Payer: 59

## 2012-05-29 ENCOUNTER — Ambulatory Visit (INDEPENDENT_AMBULATORY_CARE_PROVIDER_SITE_OTHER): Payer: 59 | Admitting: Obstetrics and Gynecology

## 2012-05-29 ENCOUNTER — Encounter: Payer: Self-pay | Admitting: Obstetrics and Gynecology

## 2012-05-29 VITALS — BP 120/74 | Ht 63.0 in | Wt 186.2 lb

## 2012-05-29 DIAGNOSIS — N926 Irregular menstruation, unspecified: Secondary | ICD-10-CM

## 2012-05-29 DIAGNOSIS — N814 Uterovaginal prolapse, unspecified: Secondary | ICD-10-CM

## 2012-05-29 DIAGNOSIS — N949 Unspecified condition associated with female genital organs and menstrual cycle: Secondary | ICD-10-CM

## 2012-05-29 DIAGNOSIS — IMO0002 Reserved for concepts with insufficient information to code with codable children: Secondary | ICD-10-CM

## 2012-05-29 DIAGNOSIS — N946 Dysmenorrhea, unspecified: Secondary | ICD-10-CM

## 2012-05-29 DIAGNOSIS — N812 Incomplete uterovaginal prolapse: Secondary | ICD-10-CM

## 2012-05-29 DIAGNOSIS — R32 Unspecified urinary incontinence: Secondary | ICD-10-CM

## 2012-05-29 DIAGNOSIS — N816 Rectocele: Secondary | ICD-10-CM

## 2012-05-29 NOTE — Progress Notes (Signed)
Patient ID: Claudia Mcintyre, female   DOB: [birth year], 37 y.o.   MRN: [id number] Pt here today to go over results of transvaginal US. Pt states she is having a little pain today.  Has investigated vag hyst, and self exam confirms descensus. Partner aware of anatomy changes GU: urination q hr, nocturia x 2-3, loses urine c cough, sneeze, standing, laugh, picking up items.  DESIRES TO PROCEED TOWARD [facility], WITH POST REPAIR.  Ros:had to perform disempaction.  Will proceed toward [facility], post repair. Will need urodynamics. 1600 cal diet

## 2012-05-29 NOTE — Patient Instructions (Signed)
We will call with instructions regarding urodynamics, and referral .

## 2012-05-31 ENCOUNTER — Telehealth: Payer: Self-pay | Admitting: Obstetrics and Gynecology

## 2012-05-31 NOTE — Telephone Encounter (Signed)
Pt was told surgery had not been scheduled, Dr. Emelda Fear had a meeting. Advised to call back the beginning of next week. JSY

## 2012-06-05 ENCOUNTER — Telehealth: Payer: Self-pay | Admitting: *Deleted

## 2012-06-05 NOTE — Telephone Encounter (Signed)
Routed to Dr. Emelda Fear. JSY

## 2012-06-07 ENCOUNTER — Telehealth: Payer: Self-pay | Admitting: Obstetrics and Gynecology

## 2012-06-07 NOTE — Telephone Encounter (Signed)
Claudia Mcintyre scheduled surgery and informed pt per [facility]. JSY

## 2012-06-07 NOTE — Telephone Encounter (Signed)
Left message stating pt should receive a call from [facility] regarding surgery within the hour per [facility]. JSY

## 2012-06-10 ENCOUNTER — Encounter (HOSPITAL_COMMUNITY): Payer: Self-pay | Admitting: Pharmacy Technician

## 2012-06-17 ENCOUNTER — Ambulatory Visit (INDEPENDENT_AMBULATORY_CARE_PROVIDER_SITE_OTHER): Payer: 59 | Admitting: Obstetrics and Gynecology

## 2012-06-17 ENCOUNTER — Other Ambulatory Visit: Payer: Self-pay | Admitting: Obstetrics and Gynecology

## 2012-06-17 ENCOUNTER — Encounter: Payer: Self-pay | Admitting: Obstetrics and Gynecology

## 2012-06-17 ENCOUNTER — Other Ambulatory Visit (HOSPITAL_COMMUNITY)
Admission: RE | Admit: 2012-06-17 | Discharge: 2012-06-17 | Disposition: A | Discharge status: Home / Self Care | Payer: 59 | Source: Ambulatory Visit | Attending: Obstetrics and Gynecology | Admitting: Obstetrics and Gynecology

## 2012-06-17 VITALS — BP 120/90 | Ht 63.0 in | Wt 185.8 lb

## 2012-06-17 DIAGNOSIS — N816 Rectocele: Secondary | ICD-10-CM | POA: Insufficient documentation

## 2012-06-17 DIAGNOSIS — Z1272 Encounter for screening for malignant neoplasm of vagina: Secondary | ICD-10-CM

## 2012-06-17 DIAGNOSIS — Z1212 Encounter for screening for malignant neoplasm of rectum: Secondary | ICD-10-CM

## 2012-06-17 DIAGNOSIS — Z1151 Encounter for screening for human papillomavirus (HPV): Secondary | ICD-10-CM | POA: Insufficient documentation

## 2012-06-17 DIAGNOSIS — Z01419 Encounter for gynecological examination (general) (routine) without abnormal findings: Secondary | ICD-10-CM

## 2012-06-17 DIAGNOSIS — Z029 Encounter for administrative examinations, unspecified: Secondary | ICD-10-CM

## 2012-06-17 LAB — HEMOCCULT GUIAC POC 1CARD (OFFICE): Fecal Occult Blood, POC: NEGATIVE

## 2012-06-17 NOTE — Progress Notes (Signed)
Patient ID: Claudia Mcintyre, female   DOB: 25-Jun-1975, 37 y.o.   MRN: [id number] Pt here today for a pre-op visit for Vaginal Hysterectomy, posterior repair and removal hemorrhoid Claudia Mcintyre is an 37 y.o. female. This is final preoperative evaluation, with vaginal hysterectomy posterior repair plan for 06/25/2012. Patient experiences severe pelvic discomfort related to her second-degree uterine descensus, with uterine retroversion. This also resulted in dyspareunia such that the patient has become abstinent.  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 She has a long external hemorrhoid that will be removed. She's had one prior hemorrhoidectomy and is familiar with the procedure The patient denies significant bladder discomfort, noting nocturia x1, and only occasional stress incontinence with coughing lifting or straining. She's not desirous of addressing the bladder search at this time. Surgical procedure has reviewed  with the patient using visual guide, questions are answered to patient's satisfaction. She has no history of abnormal Paps. Periods are described as heavy particularly last 6 months. Transvaginal ultrasound shows the uterus to be slightly enlarged 8.8 x 7.0 x 6.6 cm retroverted with a intramural fundal fibroid, and a normal-appearing endometrial cavity, adnexa are normal.  Pap smear was repeated today   Pertinent Gynecological History: Menses: regular every 28 days without intermenstrual spotting Bleeding: Heavy, regular lasting 7 days or less Contraception: tubal ligation DES exposure: unknown Blood transfusions: none Sexually transmitted diseases: no past history Previous GYN Procedures: Tubal ligation  Last mammogram: 2 young to be required Date:  Last pap: normal Date: 3 years ago, repeated 06/17/2012 OB History:  G 2, P 2   Menstrual History: Menarche age:  Patient's last menstrual period was 06/08/2012.    History reviewed. No pertinent past medical history.  Past Surgical History  Procedure Laterality Date  . Cholecystectomy    . Tubal ligation    . Spine surgery    . Knee arthroscopy      Family History  Problem Relation Age of Onset  . Heart disease Mother   . Hypertension Mother   . Heart disease Father   . Hypertension Father   . Heart disease Maternal Grandfather   . Heart disease Paternal Grandfather     Social History:  reports that she has never smoked. She has never used smokeless tobacco. She reports that she does not drink alcohol or use illicit drugs.  Allergies: No Known Allergies   (Not in a hospital admission)  ROS denies any heart or lung problems denies asthma denies anesthesia complication  Blood pressure 120/90, height 5\' 3"  (1.6 m), weight 185 lb 12.8 oz (84.278 kg), last menstrual period 06/08/2012. Physical Exam Physical Examination: General appearance - alert, well appearing, and in no distress, oriented to person, place, and time and normal appearing weight Mental status - alert, oriented to person, place, and time, normal mood, behavior, speech, dress, motor activity, and thought processes Eyes - pupils equal and reactive, extraocular eye movements intact Mouth - mucous membranes moist, pharynx normal without lesions, dental hygiene good and No dentures or partial plate Neck - supple, no significant adenopathy, thyroid exam: thyroid is normal in size without nodules or tenderness Chest - clear to auscultation, no wheezes, rales or rhonchi, symmetric air entry Heart - normal rate and regular rhythm Abdomen - soft, nontender, nondistended, no masses or organomegaly Pelvic - VULVA: normal appearing vulva with no masses, tenderness or  lesions, single large 2 cm long external hemorrhoid desiring removal, VAGINA: , CERVIX: normal appearing cervix without  discharge or lesions, UTERUS: tenderness to movement, , retroverted, enlarged to 810 week's size, mobile, retroverted, ADNEXA: normal adnexa in size, nontender and no masses, RECTAL: rectocele noted , high, approximately 6 cm above the anus Rectal - rectocele Extremities - peripheral pulses normal, no pedal edema, no clubbing or cyanosis, Homan's sign negative bilaterally Skin - normal coloration and turgor, no rashes, no suspicious skin lesions noted   Results for orders placed in visit on 06/17/12 (from the past 24 hour(s))  HEMOCCULT GUIAC POC 1CARD (OFFICE)     Status: None   Collection Time    06/17/12  9:14 AM      Result Value Range   Fecal Occult Blood, POC Negative     Card #1 Date       Card #2 Fecal Occult Blod, POC       Card #2 Date       Card #3 Fecal Occult Blood, POC       Card #3 Date        No results found.  Assessment/Plan: 1 second-degree uterine descensus with uterine retroversion, slight uterine enlargement, with symptomatic rectocele. 2. Dyspareunia Plan: Vaginal hysterectomy posterior repair removal of hemorrhoid Preoperative plans reviewed including labs, bowel prep, and surgery was details reviewed including risks potential complications such as bleeding infection hematoma, need for transfusion or anesthesia risks. Patient acknowledges understanding these inherent risks Lynita Groseclose V 06/17/2012, 9:16 AM

## 2012-06-17 NOTE — Patient Instructions (Addendum)
labs Thursday Bowel prep Monday Nothing by mouth on Monday night after midnightHysterectomy Information A hysterectomy is a surgery to remove your uterus. After surgery, you will not have periods (menses) or be able to get pregnant.  REASONS FOR THIS SURGERY You have bleeding that is not normal and keeps coming back. You have lasting (chronic) lower belly (pelvic) pain. You have a lasting infection. The lining of your uterus grows outside your uterus. Your uterus falls down into your vagina. You have a growth in your uterus that causes problems. You have cells that could turn into cancer (precancerous cells). You have cancer of the uterus or cervix. TYPES  There are 3 types of hysterectomies. Depending on the type, the surgery will: Remove the top part of the uterus only. Remove the uterus and the cervix. Remove the uterus, cervix, and tissue that holds the uterus in place in the lower belly. WAYS A HYSTERECTOMY CAN BE PERFORMED There are 5 ways this surgery can be performed.  A cut (incision) is made in the belly (abdomen). The uterus is taken out through the cut. A cut is made in the vagina. The uterus is taken out through the cut. Three to four cuts are made in the belly. A surgical device is put through the cuts. The uterus is cut into small pieces by the surgical device. The uterus is taken out through the cuts or the vagina. Three or four cuts are made in the belly. A surgical device is put through the cuts. The uterus is taken out through the vagina. Three or four cuts are made in the belly. A surgical device that is controlled by a computer makes a visual image. The image helps the surgeon control the surgical device. The uterus is cut into small pieces. The pieces are taken out though the cuts or through the vagina. WHAT TO EXPECT AFTER THE SURGERY You will be given pain medicine. You will need help at home for 3 to 5 days after surgery. You will need to see your doctor in 2 to 4  weeks after surgery. You may get hot flashes, night sweats, and have trouble sleeping. You may need to have Pap tests in the future if your surgery was related to cancer. Talk to your doctor. It is still good to have regular exams. Document Released: 03/20/2011 Document Reviewed: 03/20/2011 [facility] Patient Information 2014 [place], [place]. Hysterectomy Care After Refer to this sheet in the next few weeks. These instructions provide you with information on caring for yourself after your procedure. Your caregiver may also give you more specific instructions. Your treatment has been planned according to current medical practices, but problems sometimes occur. Call your caregiver if you have any problems or questions after your procedure. HOME CARE INSTRUCTIONS  Healing will take time. You may have discomfort, tenderness, swelling, and bruising at the surgical site for about 2 weeks. This is normal and will get better as time goes on.  Only take over-the-counter or prescription medicines for pain, discomfort, or fever as directed by your caregiver.  Do not take aspirin. It can cause bleeding.  Do not drive when taking pain medicine.  Follow your caregiver's advice regarding exercise, lifting, driving, and general activities.  Resume your usual diet as directed and allowed.  Get plenty of rest and sleep.  Do not douche, use tampons, or have sexual intercourse for at least 6 weeks or until your caregiver gives you permission.  Change your bandages (dressings) as directed by your caregiver.  Monitor your temperature.  Take showers instead of baths for 2 to 3 weeks.  Do not drink alcohol until your caregiver gives you permission.  If you are constipated, you may take a mild laxative with your caregiver's permission. Bran foods may help with constipation problems. Drinking enough fluids to keep your urine clear or pale yellow may help as well.  Try to have someone home with you for 1 or 2  weeks to help around the house.  Keep all of your follow-up appointments as directed by your caregiver. SEEK MEDICAL CARE IF:   You have swelling, redness, or increasing pain in the surgical cut (incision) area.  You have pus coming from the incision.  You notice a bad smell coming from the incision or dressing.  You have swelling, redness, or pain around the intravenous (IV) site.  Your incision breaks open.  You feel dizzy or lightheaded.  You have pain or bleeding when you urinate.  You have persistent diarrhea.  You have persistent nausea and vomiting.  You have abnormal vaginal discharge.  You have a rash.  You have any type of abnormal reaction or develop an allergy to your medicine.  Your pain is not controlled with your prescribed medicine. SEEK IMMEDIATE MEDICAL CARE IF:   You have a fever.  You have severe abdominal pain.  You have chest pain.  You have shortness of breath.  You faint.  You have pain, swelling, or redness of your leg.  You have heavy vaginal bleeding with blood clots. MAKE SURE YOU:  Understand these instructions.  Will watch your condition.  Will get help right away if you are not doing well or get worse. Document Released: 07/15/2004 Document Revised: 03/20/2011 Document Reviewed: 08/12/2010 [facility] Patient Information 2014 [place], [place].

## 2012-06-18 NOTE — H&P (Signed)
Patient ID: Claudia Mcintyre, female   DOB: 07/20/1975, 36 y.o.   MRN: [id number] Pt here today for a pre-op visit for Vaginal Hysterectomy, posterior repair and removal hemorrhoid Claudia Mcintyre is an 36 y.o. female. This is final preoperative evaluation, with vaginal hysterectomy posterior repair plan for 06/25/2012. Patient experiences severe pelvic discomfort related to her second-degree uterine descensus, with uterine retroversion. This also resulted in dyspareunia such that the patient has become abstinent.  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 She has a long external hemorrhoid that will be removed. She's had one prior hemorrhoidectomy and is familiar with the procedure The patient denies significant bladder discomfort, noting nocturia x1, and only occasional stress incontinence with coughing lifting or straining. She's not desirous of addressing the bladder search at this time. Surgical procedure has reviewed  with the patient using visual guide, questions are answered to patient's satisfaction. She has no history of abnormal Paps. Periods are described as heavy particularly last 6 months. Transvaginal ultrasound shows the uterus to be slightly enlarged 8.8 x 7.0 x 6.6 cm retroverted with a intramural fundal fibroid, and a normal-appearing endometrial cavity, adnexa are normal.  Pap smear was repeated today   Pertinent Gynecological History: Menses: regular every 28 days without intermenstrual spotting Bleeding: Heavy, regular lasting 7 days or less Contraception: tubal ligation DES exposure: unknown Blood transfusions: none Sexually transmitted diseases: no past history Previous GYN Procedures: Tubal ligation  Last mammogram: 2 young to be required Date:  Last pap: normal Date: 3 years ago, repeated 06/17/2012 OB History:  G 2, P 2   Menstrual History: Menarche age:  Patient's last menstrual period was 06/08/2012.    History reviewed. No pertinent past medical history.  Past Surgical History  Procedure Laterality Date  . Cholecystectomy    . Tubal ligation    . Spine surgery    . Knee arthroscopy      Family History  Problem Relation Age of Onset  . Heart disease Mother   . Hypertension Mother   . Heart disease Father   . Hypertension Father   . Heart disease Maternal Grandfather   . Heart disease Paternal Grandfather     Social History:  reports that she has never smoked. She has never used smokeless tobacco. She reports that she does not drink alcohol or use illicit drugs.  Allergies: No Known Allergies   (Not in a hospital admission)  ROS denies any heart or lung problems denies asthma denies anesthesia complication  Blood pressure 120/90, height 5' 3" (1.6 m), weight 185 lb 12.8 oz (84.278 kg), last menstrual period 06/08/2012. Physical Exam Physical Examination: General appearance - alert, well appearing, and in no distress, oriented to person, place, and time and normal appearing weight Mental status - alert, oriented to person, place, and time, normal mood, behavior, speech, dress, motor activity, and thought processes Eyes - pupils equal and reactive, extraocular eye movements intact Mouth - mucous membranes moist, pharynx normal without lesions, dental hygiene good and No dentures or partial plate Neck - supple, no significant adenopathy, thyroid exam: thyroid is normal in size without nodules or tenderness Chest - clear to auscultation, no wheezes, rales or rhonchi, symmetric air entry Heart - normal rate and regular rhythm Abdomen - soft, nontender, nondistended, no masses or organomegaly Pelvic - VULVA: normal appearing vulva with no masses, tenderness or   lesions, single large 2 cm long external hemorrhoid desiring removal, VAGINA: , CERVIX: normal appearing cervix without  discharge or lesions, UTERUS: tenderness to movement, , retroverted, enlarged to 810 week's size, mobile, retroverted, ADNEXA: normal adnexa in size, nontender and no masses, RECTAL: rectocele noted , high, approximately 6 cm above the anus Rectal - rectocele Extremities - peripheral pulses normal, no pedal edema, no clubbing or cyanosis, Homan's sign negative bilaterally Skin - normal coloration and turgor, no rashes, no suspicious skin lesions noted   Results for orders placed in visit on 06/17/12 (from the past 24 hour(s))  HEMOCCULT GUIAC POC 1CARD (OFFICE)     Status: None   Collection Time    06/17/12  9:14 AM      Result Value Range   Fecal Occult Blood, POC Negative     Card #1 Date       Card #2 Fecal Occult Blod, POC       Card #2 Date       Card #3 Fecal Occult Blood, POC       Card #3 Date        No results found.  Assessment/Plan: 1 second-degree uterine descensus with uterine retroversion, slight uterine enlargement, with symptomatic rectocele. 2. Dyspareunia Plan: Vaginal hysterectomy posterior repair removal of hemorrhoid Preoperative plans reviewed including labs, bowel prep, and surgery was details reviewed including risks potential complications such as bleeding infection hematoma, need for transfusion or anesthesia risks. Patient acknowledges understanding these inherent risks Joeziah Voit V 06/17/2012, 9:16 AM  

## 2012-06-19 NOTE — Patient Instructions (Addendum)
Claudia Mcintyre  06/19/2012   Your procedure is scheduled on:   06/25/2012  Report to [facility] at  800  AM.  Call this number if you have problems the morning of surgery: [phone]   Remember:   Do not eat food or drink liquids after midnight.   Take these medicines the morning of surgery with A SIP OF WATER:  none   Do not wear jewelry, make-up or nail polish.  Do not wear lotions, powders, or perfumes.   Do not shave 48 hours prior to surgery. Men may shave face and neck.  Do not bring valuables to the hospital.  [facility] is not responsible  for any belongings or valuables.  Contacts, dentures or bridgework may not be worn into surgery.  Leave suitcase in the car. After surgery it may be brought to your room.  For patients admitted to the hospital, checkout time is 11:00 AM the day of discharge.   Patients discharged the day of surgery will not be allowed to drive  home.  Name and phone number of your driver: family  Special Instructions: Shower using CHG 2 nights before surgery and the night before surgery.  If you shower the day of surgery use CHG.  Use special wash - you have one bottle of CHG for all showers.  You should use approximately 1/3 of the bottle for each shower.   Please read over the following fact sheets that you were given: Pain Booklet, Coughing and Deep Breathing, MRSA Information, Surgical Site Infection Prevention, Anesthesia Post-op Instructions and Care and Recovery After Surgery Hysterectomy Information  A hysterectomy is a procedure where your uterus is surgically removed. It will no longer be possible to have menstrual periods or to become pregnant. The tubes and ovaries can be removed (bilateral salpingo-oopherectomy) during this surgery as well.  REASONS FOR A HYSTERECTOMY  Persistent, abnormal bleeding.  Lasting (chronic) pelvic pain or infection.  The lining of the uterus (endometrium) starts growing outside the uterus  (endometriosis).  The endometrium starts growing in the muscle of the uterus (adenomyosis).  The uterus falls down into the vagina (pelvic organ prolapse).  Symptomatic uterine fibroids.  Precancerous cells.  Cervical cancer or uterine cancer. TYPES OF HYSTERECTOMIES  Supracervical hysterectomy. This type removes the top part of the uterus, but not the cervix.  Total hysterectomy. This type removes the uterus and cervix.  Radical hysterectomy. This type removes the uterus, cervix, and the fibrous tissue that holds the uterus in place in the pelvis (parametrium). WAYS A HYSTERECTOMY CAN BE PERFORMED  Abdominal hysterectomy. A large surgical cut (incision) is made in the abdomen. The uterus is removed through this incision.  Vaginal hysterectomy. An incision is made in the vagina. The uterus is removed through this incision. There are no abdominal incisions.  Conventional laparoscopic hysterectomy. A thin, lighted tube with a camera (laparoscope) is inserted into 3 or 4 small incisions in the abdomen. The uterus is cut into small pieces. The small pieces are removed through the incisions, or they are removed through the vagina.  Laparoscopic assisted vaginal hysterectomy (LAVH). Three or four small incisions are made in the abdomen. Part of the surgery is performed laparoscopically and part vaginally. The uterus is removed through the vagina.  Robot-assisted laparoscopic hysterectomy. A laparoscope is inserted into 3 or 4 small incisions in the abdomen. A computer-controlled device is used to give the surgeon a 3D image. This allows for more precise movements of  surgical instruments. The uterus is cut into small pieces and removed through the incisions or removed through the vagina. RISKS OF HYSTERECTOMY   Bleeding and risk of blood transfusion. Tell your caregiver if you do not want to receive any blood products.  Blood clots in the legs or lung.  Infection.  Injury to surrounding  organs.  Anesthesia problems or side effects.  Conversion to an abdominal hysterectomy. WHAT TO EXPECT AFTER A HYSTERECTOMY  You will be given pain medicine.  You will need to have someone with you for the first 3 to 5 days after you go home.  You will need to follow up with your surgeon in 2 to 4 weeks after surgery to evaluate your progress.  You may have early menopause symptoms like hot flashes, night sweats, and insomnia.  If you had a hysterectomy for a problem that was not a cancer or a condition that could lead to cancer, then you no longer need Pap tests. However, even if you no longer need a Pap test, a regular exam is a good idea to make sure no other problems are starting. Document Released: 06/21/2000 Document Revised: 03/20/2011 Document Reviewed: 08/06/2010 [facility] Patient Information 2014 [place], [place]. PATIENT INSTRUCTIONS POST-ANESTHESIA  IMMEDIATELY FOLLOWING SURGERY:  Do not drive or operate machinery for the first twenty four hours after surgery.  Do not make any important decisions for twenty four hours after surgery or while taking narcotic pain medications or sedatives.  If you develop intractable nausea and vomiting or a severe headache please notify your doctor immediately.  FOLLOW-UP:  Please make an appointment with your surgeon as instructed. You do not need to follow up with anesthesia unless specifically instructed to do so.  WOUND CARE INSTRUCTIONS (if applicable):  Keep a dry clean dressing on the anesthesia/puncture wound site if there is drainage.  Once the wound has quit draining you may leave it open to air.  Generally you should leave the bandage intact for twenty four hours unless there is drainage.  If the epidural site drains for more than 36-48 hours please call the anesthesia department.  QUESTIONS?:  Please feel free to call your physician or the hospital operator if you have any questions, and they will be happy to assist you.

## 2012-06-20 ENCOUNTER — Telehealth: Payer: Self-pay | Admitting: Obstetrics and Gynecology

## 2012-06-20 ENCOUNTER — Encounter (HOSPITAL_COMMUNITY)
Admission: RE | Admit: 2012-06-20 | Discharge: 2012-06-20 | Disposition: A | Discharge status: Home / Self Care | Payer: 59 | Source: Ambulatory Visit | Attending: Obstetrics and Gynecology | Admitting: Obstetrics and Gynecology

## 2012-06-20 ENCOUNTER — Encounter (HOSPITAL_COMMUNITY): Payer: Self-pay

## 2012-06-20 LAB — URINALYSIS, ROUTINE W REFLEX MICROSCOPIC
Leukocytes, UA: NEGATIVE
Protein, ur: NEGATIVE mg/dL
Urobilinogen, UA: 0.2 mg/dL (ref 0.0–1.0)

## 2012-06-20 LAB — BASIC METABOLIC PANEL
BUN: 13 mg/dL (ref 6–23)
Chloride: 104 mEq/L (ref 96–112)
GFR calc Af Amer: 88 mL/min — ABNORMAL LOW (ref >90)
Potassium: 4 mEq/L (ref 3.5–5.1)

## 2012-06-20 LAB — CBC
HCT: 39.8 % (ref 36.0–46.0)
Hemoglobin: 14.3 g/dL (ref 12.0–15.0)
WBC: 8.5 10*3/uL (ref 4.0–10.5)

## 2012-06-20 LAB — SURGICAL PCR SCREEN: MRSA, PCR: NEGATIVE

## 2012-06-20 LAB — URINE MICROSCOPIC-ADD ON

## 2012-06-20 MED ORDER — FLUCONAZOLE 150 MG PO TABS: 150.0000 mg | ORAL_TABLET | Freq: Once | ORAL | Status: DC

## 2012-06-20 NOTE — Telephone Encounter (Signed)
Diflucan 150 mg q 3 d x 2 pills called in  Pt aware Pap normal x yeast present  Pt aware jvf

## 2012-06-23 LAB — TYPE AND SCREEN
ABO/RH(D): O POS
Antibody Screen: NEGATIVE

## 2012-06-25 ENCOUNTER — Encounter (HOSPITAL_COMMUNITY)
Admission: RE | Disposition: A | Discharge status: Home / Self Care | Payer: Self-pay | Source: Ambulatory Visit | Attending: Obstetrics and Gynecology

## 2012-06-25 ENCOUNTER — Ambulatory Visit (HOSPITAL_COMMUNITY): Payer: 59 | Admitting: Anesthesiology

## 2012-06-25 ENCOUNTER — Observation Stay (HOSPITAL_COMMUNITY)
Admission: RE | Admit: 2012-06-25 | Discharge: 2012-06-26 | Disposition: A | Discharge status: Home / Self Care | Payer: 59 | Source: Ambulatory Visit | Attending: Obstetrics and Gynecology | Admitting: Obstetrics and Gynecology

## 2012-06-25 ENCOUNTER — Encounter (HOSPITAL_COMMUNITY): Payer: Self-pay | Admitting: Anesthesiology

## 2012-06-25 ENCOUNTER — Encounter (HOSPITAL_COMMUNITY): Payer: Self-pay | Admitting: *Deleted

## 2012-06-25 DIAGNOSIS — N814 Uterovaginal prolapse, unspecified: Principal | ICD-10-CM | POA: Insufficient documentation

## 2012-06-25 DIAGNOSIS — Z8719 Personal history of other diseases of the digestive system: Secondary | ICD-10-CM

## 2012-06-25 DIAGNOSIS — N816 Rectocele: Secondary | ICD-10-CM

## 2012-06-25 DIAGNOSIS — Z90710 Acquired absence of both cervix and uterus: Secondary | ICD-10-CM

## 2012-06-25 DIAGNOSIS — K649 Unspecified hemorrhoids: Secondary | ICD-10-CM | POA: Insufficient documentation

## 2012-06-25 DIAGNOSIS — K648 Other hemorrhoids: Secondary | ICD-10-CM | POA: Diagnosis present

## 2012-06-25 HISTORY — PX: RECTOCELE REPAIR: SHX761

## 2012-06-25 HISTORY — PX: VAGINAL HYSTERECTOMY: SHX2639

## 2012-06-25 HISTORY — PX: HEMORRHOID SURGERY: SHX153

## 2012-06-25 SURGERY — HYSTERECTOMY, VAGINAL
Anesthesia: General | Site: Vagina | Wound class: Clean Contaminated

## 2012-06-25 MED ORDER — SUFENTANIL CITRATE 50 MCG/ML IV SOLN
INTRAVENOUS | Status: DC | PRN
  Administered 2012-06-25 (×3): 10 ug via INTRAVENOUS

## 2012-06-25 MED ORDER — ONDANSETRON HCL 4 MG/2ML IJ SOLN
INTRAMUSCULAR | Status: AC
  Filled 2012-06-25: qty 2

## 2012-06-25 MED ORDER — FENTANYL CITRATE 0.05 MG/ML IJ SOLN
INTRAMUSCULAR | Status: DC | PRN
  Administered 2012-06-25: 50 ug via INTRAVENOUS
  Administered 2012-06-25: 150 ug via INTRAVENOUS
  Administered 2012-06-25: 50 ug via INTRAVENOUS

## 2012-06-25 MED ORDER — HYDROMORPHONE HCL PF 1 MG/ML IJ SOLN
0.2500 mg | INTRAMUSCULAR | Status: DC | PRN
  Administered 2012-06-25 (×4): 0.5 mg via INTRAVENOUS

## 2012-06-25 MED ORDER — FENTANYL CITRATE 0.05 MG/ML IJ SOLN
INTRAMUSCULAR | Status: AC
  Filled 2012-06-25: qty 5

## 2012-06-25 MED ORDER — CEFAZOLIN SODIUM-DEXTROSE 2-3 GM-% IV SOLR
INTRAVENOUS | Status: AC
  Filled 2012-06-25: qty 50

## 2012-06-25 MED ORDER — ONDANSETRON HCL 4 MG PO TABS
4.0000 mg | ORAL_TABLET | Freq: Four times a day (QID) | ORAL | Status: DC | PRN
  Administered 2012-06-25: 4 mg via ORAL
  Filled 2012-06-25: qty 1

## 2012-06-25 MED ORDER — ONDANSETRON HCL 4 MG/2ML IJ SOLN: 4.0000 mg | Freq: Four times a day (QID) | INTRAMUSCULAR | Status: DC | PRN

## 2012-06-25 MED ORDER — GLYCOPYRROLATE 0.2 MG/ML IJ SOLN
INTRAMUSCULAR | Status: AC
  Filled 2012-06-25: qty 1

## 2012-06-25 MED ORDER — ONDANSETRON HCL 4 MG/2ML IJ SOLN
4.0000 mg | Freq: Once | INTRAMUSCULAR | Status: AC
  Administered 2012-06-25: 4 mg via INTRAVENOUS

## 2012-06-25 MED ORDER — KETOROLAC TROMETHAMINE 30 MG/ML IJ SOLN
30.0000 mg | Freq: Four times a day (QID) | INTRAMUSCULAR | Status: DC
  Filled 2012-06-25 (×2): qty 1

## 2012-06-25 MED ORDER — STERILE WATER FOR IRRIGATION IR SOLN
Status: DC | PRN
  Administered 2012-06-25: 1000 mL

## 2012-06-25 MED ORDER — DIPHENHYDRAMINE HCL 50 MG/ML IJ SOLN: 12.5000 mg | Freq: Four times a day (QID) | INTRAMUSCULAR | Status: DC | PRN

## 2012-06-25 MED ORDER — HYDROMORPHONE HCL PF 1 MG/ML IJ SOLN
INTRAMUSCULAR | Status: AC
  Filled 2012-06-25: qty 1

## 2012-06-25 MED ORDER — KCL IN DEXTROSE-NACL 20-5-0.45 MEQ/L-%-% IV SOLN
INTRAVENOUS | Status: DC
  Administered 2012-06-25 – 2012-06-26 (×2): via INTRAVENOUS

## 2012-06-25 MED ORDER — PROPOFOL 10 MG/ML IV EMUL
INTRAVENOUS | Status: AC
  Filled 2012-06-25: qty 20

## 2012-06-25 MED ORDER — MIDAZOLAM HCL 2 MG/2ML IJ SOLN
1.0000 mg | INTRAMUSCULAR | Status: AC | PRN
  Administered 2012-06-25 (×3): 2 mg via INTRAVENOUS

## 2012-06-25 MED ORDER — MIDAZOLAM HCL 2 MG/2ML IJ SOLN
INTRAMUSCULAR | Status: AC
  Filled 2012-06-25: qty 2

## 2012-06-25 MED ORDER — KETOROLAC TROMETHAMINE 30 MG/ML IJ SOLN
30.0000 mg | Freq: Four times a day (QID) | INTRAMUSCULAR | Status: DC
  Administered 2012-06-25 – 2012-06-26 (×2): 30 mg via INTRAVENOUS

## 2012-06-25 MED ORDER — DEXAMETHASONE SODIUM PHOSPHATE 10 MG/ML IJ SOLN
INTRAMUSCULAR | Status: DC | PRN
  Administered 2012-06-25: 8 mg via INTRAVENOUS

## 2012-06-25 MED ORDER — FENTANYL CITRATE 0.05 MG/ML IJ SOLN
INTRAMUSCULAR | Status: AC
  Filled 2012-06-25: qty 2

## 2012-06-25 MED ORDER — BUPIVACAINE-EPINEPHRINE PF 0.5-1:200000 % IJ SOLN
INTRAMUSCULAR | Status: AC
  Filled 2012-06-25: qty 10

## 2012-06-25 MED ORDER — LACTATED RINGERS IV SOLN
INTRAVENOUS | Status: AC
  Filled 2012-06-25: qty 1000

## 2012-06-25 MED ORDER — BUPIVACAINE-EPINEPHRINE 0.5% -1:200000 IJ SOLN
INTRAMUSCULAR | Status: DC | PRN
  Administered 2012-06-25: 20 mL

## 2012-06-25 MED ORDER — LIDOCAINE HCL (CARDIAC) 20 MG/ML IV SOLN
INTRAVENOUS | Status: DC | PRN
  Administered 2012-06-25: 30 mg via INTRAVENOUS

## 2012-06-25 MED ORDER — ROCURONIUM BROMIDE 50 MG/5ML IV SOLN
INTRAVENOUS | Status: AC
  Filled 2012-06-25: qty 1

## 2012-06-25 MED ORDER — DIPHENHYDRAMINE HCL 12.5 MG/5ML PO ELIX: 12.5000 mg | ORAL_SOLUTION | Freq: Four times a day (QID) | ORAL | Status: DC | PRN

## 2012-06-25 MED ORDER — LACTATED RINGERS IV SOLN
INTRAVENOUS | Status: DC | PRN
  Administered 2012-06-25 (×3): via INTRAVENOUS

## 2012-06-25 MED ORDER — LIDOCAINE HCL (PF) 1 % IJ SOLN
INTRAMUSCULAR | Status: AC
  Filled 2012-06-25: qty 5

## 2012-06-25 MED ORDER — HYDROMORPHONE 0.3 MG/ML IV SOLN
INTRAVENOUS | Status: DC
  Administered 2012-06-25: 16:00:00 via INTRAVENOUS
  Administered 2012-06-25: 2.4 mg via INTRAVENOUS
  Administered 2012-06-25: 0.66 mg via INTRAVENOUS
  Administered 2012-06-26: 2.1 mg via INTRAVENOUS
  Filled 2012-06-25: qty 25

## 2012-06-25 MED ORDER — SIMETHICONE 80 MG PO CHEW: 80.0000 mg | CHEWABLE_TABLET | Freq: Four times a day (QID) | ORAL | Status: DC | PRN

## 2012-06-25 MED ORDER — FENTANYL CITRATE 0.05 MG/ML IJ SOLN
25.0000 ug | INTRAMUSCULAR | Status: DC | PRN
  Administered 2012-06-25 (×4): 50 ug via INTRAVENOUS

## 2012-06-25 MED ORDER — KETOROLAC TROMETHAMINE 30 MG/ML IJ SOLN
30.0000 mg | Freq: Once | INTRAMUSCULAR | Status: AC
  Administered 2012-06-25: 30 mg via INTRAVENOUS

## 2012-06-25 MED ORDER — PROPOFOL 10 MG/ML IV BOLUS
INTRAVENOUS | Status: DC | PRN
  Administered 2012-06-25: 140 mg via INTRAVENOUS

## 2012-06-25 MED ORDER — KETOROLAC TROMETHAMINE 30 MG/ML IJ SOLN
INTRAMUSCULAR | Status: AC
  Filled 2012-06-25: qty 1

## 2012-06-25 MED ORDER — ONDANSETRON HCL 4 MG/2ML IJ SOLN
4.0000 mg | Freq: Once | INTRAMUSCULAR | Status: AC | PRN
  Administered 2012-06-25: 4 mg via INTRAVENOUS

## 2012-06-25 MED ORDER — GLYCOPYRROLATE 0.2 MG/ML IJ SOLN
INTRAMUSCULAR | Status: DC | PRN
  Administered 2012-06-25: 0.6 mg via INTRAVENOUS

## 2012-06-25 MED ORDER — NALOXONE HCL 0.4 MG/ML IJ SOLN: 0.4000 mg | INTRAMUSCULAR | Status: DC | PRN

## 2012-06-25 MED ORDER — SUFENTANIL CITRATE 50 MCG/ML IV SOLN
INTRAVENOUS | Status: AC
  Filled 2012-06-25: qty 1

## 2012-06-25 MED ORDER — NEOSTIGMINE METHYLSULFATE 1 MG/ML IJ SOLN
INTRAMUSCULAR | Status: DC | PRN
  Administered 2012-06-25: 4 mg via INTRAVENOUS

## 2012-06-25 MED ORDER — NEOSTIGMINE METHYLSULFATE 1 MG/ML IJ SOLN
INTRAMUSCULAR | Status: AC
  Filled 2012-06-25: qty 1

## 2012-06-25 MED ORDER — ROCURONIUM BROMIDE 100 MG/10ML IV SOLN
INTRAVENOUS | Status: DC | PRN
  Administered 2012-06-25: 40 mg via INTRAVENOUS
  Administered 2012-06-25: 10 mg via INTRAVENOUS

## 2012-06-25 MED ORDER — OXYCODONE-ACETAMINOPHEN 5-325 MG PO TABS: 1.0000 | ORAL_TABLET | ORAL | Status: DC | PRN

## 2012-06-25 MED ORDER — SUCCINYLCHOLINE CHLORIDE 20 MG/ML IJ SOLN
INTRAMUSCULAR | Status: AC
  Filled 2012-06-25: qty 1

## 2012-06-25 MED ORDER — LACTATED RINGERS IV SOLN
INTRAVENOUS | Status: DC
  Administered 2012-06-25: 1000 mL via INTRAVENOUS

## 2012-06-25 MED ORDER — SODIUM CHLORIDE 0.9 % IJ SOLN: 9.0000 mL | INTRAMUSCULAR | Status: DC | PRN

## 2012-06-25 MED ORDER — SURGILUBE EX GEL
CUTANEOUS | Status: DC | PRN
  Administered 2012-06-25: 1 via TOPICAL

## 2012-06-25 MED ORDER — CEFAZOLIN SODIUM-DEXTROSE 2-3 GM-% IV SOLR
2.0000 g | INTRAVENOUS | Status: AC
  Administered 2012-06-25: 2 g via INTRAVENOUS

## 2012-06-25 MED ORDER — 0.9 % SODIUM CHLORIDE (POUR BTL) OPTIME
TOPICAL | Status: DC | PRN
  Administered 2012-06-25: 1000 mL

## 2012-06-25 MED ORDER — DOCUSATE SODIUM 100 MG PO CAPS
100.0000 mg | ORAL_CAPSULE | Freq: Two times a day (BID) | ORAL | Status: DC
  Administered 2012-06-25 – 2012-06-26 (×2): 100 mg via ORAL
  Filled 2012-06-25 (×2): qty 1

## 2012-06-25 SURGICAL SUPPLY — 46 items
BAG HAMPER (MISCELLANEOUS) ×5 IMPLANT
CLOTH BEACON ORANGE TIMEOUT ST (SAFETY) ×5 IMPLANT
COVER LIGHT HANDLE STERIS (MISCELLANEOUS) ×10 IMPLANT
COVER MAYO STAND XLG (DRAPE) ×5 IMPLANT
DECANTER SPIKE VIAL GLASS SM (MISCELLANEOUS) ×6 IMPLANT
DRAPE PROXIMA HALF (DRAPES) ×6 IMPLANT
DRAPE STERI URO 9X17 APER PCH (DRAPES) ×3 IMPLANT
ELECT REM PT RETURN 9FT ADLT (ELECTROSURGICAL) ×6
ELECTRODE REM PT RTRN 9FT ADLT (ELECTROSURGICAL) ×4 IMPLANT
FORMALIN 10 PREFIL 120ML (MISCELLANEOUS) ×3 IMPLANT
FORMALIN 10 PREFIL 480ML (MISCELLANEOUS) ×3 IMPLANT
GAUZE PACKING 2X5 YD STERILE (GAUZE/BANDAGES/DRESSINGS) ×3 IMPLANT
GAUZE SPONGE 4X4 16PLY XRAY LF (GAUZE/BANDAGES/DRESSINGS) ×1 IMPLANT
GLOVE BIOGEL PI IND STRL 7.5 (GLOVE) ×2 IMPLANT
GLOVE BIOGEL PI INDICATOR 7.5 (GLOVE) ×1
GLOVE ECLIPSE 7.0 STRL STRAW (GLOVE) ×5 IMPLANT
GLOVE ECLIPSE 9.0 STRL (GLOVE) ×5 IMPLANT
GLOVE INDICATOR STER SZ 9 (GLOVE) ×3 IMPLANT
GOWN STRL REIN 3XL LVL4 (GOWN DISPOSABLE) ×3 IMPLANT
GOWN STRL REIN XL XLG (GOWN DISPOSABLE) ×13 IMPLANT
HEMOSTAT SURGICEL 4X8 (HEMOSTASIS) ×2 IMPLANT
IV NS IRRIG 3000ML ARTHROMATIC (IV SOLUTION) ×3 IMPLANT
KIT ROOM TURNOVER AP CYSTO (KITS) ×3 IMPLANT
KIT ROOM TURNOVER APOR (KITS) ×3 IMPLANT
MANIFOLD NEPTUNE II (INSTRUMENTS) ×6 IMPLANT
NDL HYPO 25X1 1.5 SAFETY (NEEDLE) ×4 IMPLANT
NEEDLE HYPO 25X1 1.5 SAFETY (NEEDLE) ×6 IMPLANT
NS IRRIG 1000ML POUR BTL (IV SOLUTION) ×6 IMPLANT
PACK PERI GYN (CUSTOM PROCEDURE TRAY) ×6 IMPLANT
PAD ARMBOARD 7.5X6 YLW CONV (MISCELLANEOUS) ×6 IMPLANT
SET BASIN LINEN APH (SET/KITS/TRAYS/PACK) ×6 IMPLANT
SET IV ADMIN VERSALIGHT (MISCELLANEOUS) ×3 IMPLANT
SPONGE GAUZE 4X4 12PLY (GAUZE/BANDAGES/DRESSINGS) ×3 IMPLANT
SUT CHROMIC 0 CT 1 (SUTURE) ×25 IMPLANT
SUT CHROMIC 2 0 CT 1 (SUTURE) ×8 IMPLANT
SUT CHROMIC 3 0 SH 27 (SUTURE) ×5 IMPLANT
SUT CHROMIC GUT BROWN 0 54 (SUTURE) ×2 IMPLANT
SUT CHROMIC GUT BROWN 0 54IN (SUTURE) ×3
SUT PROLENE 2 0 SH 30 (SUTURE) ×1 IMPLANT
SUT SILK 0 FSL (SUTURE) ×2 IMPLANT
SUT VIC AB 0 CT2 8-18 (SUTURE) ×1 IMPLANT
SYR CONTROL 10ML LL (SYRINGE) ×4 IMPLANT
TRAY FOLEY BAG SILVER LF 14FR (CATHETERS) ×3 IMPLANT
TRAY FOLEY CATH 16FR SILVER (SET/KITS/TRAYS/PACK) IMPLANT
VERSALIGHT (MISCELLANEOUS) ×3 IMPLANT
WATER STERILE IRR 1000ML POUR (IV SOLUTION) ×3 IMPLANT

## 2012-06-25 NOTE — Preoperative (Signed)
Beta Blockers   Reason not to administer Beta Blockers:Not Applicable 

## 2012-06-25 NOTE — Op Note (Signed)
PRE-OPERATIVE DIAGNOSIS:  uterine decensus, rectocle, hemorrhoid  POST-OPERATIVE DIAGNOSIS:  uterine decensus, rectocle, hemorrhoid  PROCEDURE:  Procedure(s): HYSTERECTOMY VAGINAL (N/A) POSTERIOR REPAIR (RECTOCELE) (N/A) HEMORRHOIDECTOMY (N/A)  SURGEON:  Surgeon(s) and Role: Panel 1:    * [name], MD - Primary Details of procedure: Patient was taken to the operating room prepped and draped for vaginal procedure and perineal procedure. Antibiotics were administered timeout was conducted. Dr. Leticia Penna perform his portion of procedure first. See his notes regarding the operative procedure on the hemorrhoidectomy. Upon completion of his portion of the procedure, I changed gloves, second timeout conducted, and hysterectomy initiated. Vaginal bib was put in place, weighted speculum placed in the posterior vagina and cervix circumscribed with Bovie cautery after injection with 10 cc of Marcaine with epinephrine. Posterior colpotomy incision was made entering the cul-de-sac without difficulty. Anteriorly the bladder could be pushed upwards but the anterior vesicouterine peritoneum was not immediately identified. The uterosacral ligaments were clamped cut and suture ligated on either side. Lower cardinal ligaments were clamped cut and suture ligated again using 0 chromic suture as was used throughout the case except as described elsewhere. The anterior vesicouterine reflection of peritoneum couldn't be identified, and then the broad ligament was serially clamped, transected and 0 chromics suture ligated.Upon reaching  two thirds with the uterus, the cervix and lower uterine segment were amputated off to allow side-to-side mobility of the uterine fundus. The upper portions of the broad ligament, the round ligament, utero-ovarian ligament and fallopian tube clicks were serially clamped cut and suture ligated in small bites marching up each side. The upper most ligature was tagged and held for  identification. Pedicles were inspected and confirmed as hemostatic. The pedicles were released. The only oozing was from the posterior cul-de-sac, along the cut edge. Individual point cautery was used as necessary. Foley catheter was inserted. The anterior peritoneum could be attached to the posterior cul-de-sac with running 2-0 chromic. The inner aspects of the uterosacral ligaments were sutured with 2-0 Prolene to the pouch of Douglas on each side, incorporating approximately 1 cm of the cul-de-sac on each side into the ligature which reduced the width of the cul-de-sac and improved vaginal cuff support. Particular care was made to stay close to the uterine and of the uterosacral ligaments and not to cross the midline with suture. The cuff was then closed front to back with a series of interrupted 0 chromic sutures. Next Posterior repair was then performed. The posterior perineal body was grasped at the level of the hymen remnants, the epithelium infiltrated with another 10 cc of Marcaine with epinephrine, and a transverse incision made at the level of the old episiotomy, and the underlying vaginal epithelium dissected off the connective tissue beneath it. The tissues in this area were quite relaxed. Allis clamps were used to grasp the lateral tissues and: In the midline where 0 Vicryl sutures x4 resulted in improved perineal body support. The epithelium was then closed over its surface using 2-0 chromic. Digital rectal exam had been performed x2 during this portion the case to ensure that the rectum remained intact and uninvolved in the surgery. Perineal support was improved. Epithelium was reapproximated, vaginal packing inserted. Foley catheter had been reinserted prior to closure of the vaginal cuff revealing clear urine. Patient tolerated procedure well and went to recovery room in excellent condition sponge and needle counts correct

## 2012-06-25 NOTE — Transfer of Care (Signed)
Immediate Anesthesia Transfer of Care Note  Patient: Claudia Mcintyre  Procedure(s) Performed: Procedure(s): HYSTERECTOMY VAGINAL (N/A) POSTERIOR REPAIR (RECTOCELE) (N/A) HEMORRHOIDECTOMY (N/A)  Patient Location: PACU  Anesthesia Type:General  Level of Consciousness: sedated and patient cooperative  Airway & Oxygen Therapy: Patient Spontanous Breathing and Patient connected to face mask oxygen  Post-op Assessment: Report given to PACU RN and Post -op Vital signs reviewed and stable  Post vital signs: Reviewed and stable  Complications: No apparent anesthesia complications

## 2012-06-25 NOTE — Anesthesia Preprocedure Evaluation (Signed)
Anesthesia Evaluation  Patient identified by MRN, date of birth, ID band Patient awake    Reviewed: Allergy & Precautions, H&P , NPO status , Patient's Chart, lab work & pertinent test results  Airway Mallampati: I  TM Distance: >3 FB Neck ROM: Full    Dental  (+) Teeth Intact   Pulmonary neg pulmonary ROS,    breath sounds clear to auscultation       Cardiovascular negative cardio ROS   Rhythm:Regular Rate:Normal     Neuro/Psych    GI/Hepatic negative GI ROS,   Endo/Other    Renal/GU      Musculoskeletal   Abdominal   Peds  Hematology   Anesthesia Other Findings   Reproductive/Obstetrics                             Anesthesia Physical Anesthesia Plan  ASA: I  Anesthesia Plan: General   Post-op Pain Management:    Induction: Intravenous  Airway Management Planned: Oral ETT  Additional Equipment:   Intra-op Plan:   Post-operative Plan: Extubation in OR  Informed Consent: I have reviewed the patients History and Physical, chart, labs and discussed the procedure including the risks, benefits and alternatives for the proposed anesthesia with the patient or authorized representative who has indicated his/her understanding and acceptance.     Plan Discussed with:   Anesthesia Plan Comments:         Anesthesia Quick Evaluation  

## 2012-06-25 NOTE — Progress Notes (Signed)
Report to s tickle rn. Transferred to 328 per s tickle rn.

## 2012-06-25 NOTE — Brief Op Note (Signed)
06/25/2012  2:01 PM  PATIENT:  Claudia Mcintyre  37 y.o. female  PRE-OPERATIVE DIAGNOSIS:  uterine decensus, rectocle, hemorrhoid  POST-OPERATIVE DIAGNOSIS:  uterine decensus, rectocle, hemorrhoid  PROCEDURE:  Procedure(s): HYSTERECTOMY VAGINAL (N/A) POSTERIOR REPAIR (RECTOCELE) (N/A) HEMORRHOIDECTOMY (N/A)  SURGEON:  Surgeon(s) and Role: Panel 1:    * [name], MD - Primary  Panel 2:    * Fabio Bering, MD - Primary  PHYSICIAN ASSISTANT:   ASSISTANTS: Blackwell., CST-FA   ANESTHESIA:   local and general  EBL:  Total I/O In: 2000 [I.V.:2000] Out: 200 [Blood:200]  BLOOD ADMINISTERED:none  DRAINS: Urinary Catheter (Foley) and vagina pack   LOCAL MEDICATIONS USED:  MARCAINE    and Amount: 20 ml  SPECIMEN:  Source of Specimen:  Uterus and cervix  DISPOSITION OF SPECIMEN:  PATHOLOGY  COUNTS:  YES  TOURNIQUET:  * No tourniquets in log *  DICTATION: .Dragon Dictation  PLAN OF CARE: Admit for overnight observation  PATIENT DISPOSITION:  PACU - hemodynamically stable.   Delay start of Pharmacological VTE agent (>24hrs) due to surgical blood loss or risk of bleeding: not applicable

## 2012-06-25 NOTE — Progress Notes (Signed)
No rectal packing in place.

## 2012-06-25 NOTE — Interval H&P Note (Signed)
   History and Physical Interval Note:  06/25/2012 9:35 AM  Claudia Mcintyre  has presented today for surgery, with the diagnosis of uterine decensus, rectocle  The various methods of treatment have been discussed with the patient and family. After consideration of risks, benefits and other options for treatment, the patient has consented to  Procedure(s): HYSTERECTOMY VAGINAL (N/A) POSTERIOR REPAIR (RECTOCELE) (N/A) HEMORRHOIDECTOMY (N/A) as a surgical intervention .  The patient's history has been reviewed, patient examined, no change in status, stable for surgery.  I have reviewed the patient's chart and labs.  Questions were answered to the patient's satisfaction.    The procedure will be done as follows: [name] will perform hysterectomy and posterior repair, and [name] will perform the Hemorrhoidectomy.  [name]

## 2012-06-25 NOTE — Progress Notes (Signed)
Ready for d/c to room. Nurse unable to take report at this time. Will return call for report.

## 2012-06-25 NOTE — Anesthesia Postprocedure Evaluation (Addendum)
  Anesthesia Post-op Note  Patient: Claudia Mcintyre  Procedure(s) Performed: Procedure(s): HYSTERECTOMY VAGINAL (N/A) POSTERIOR REPAIR (RECTOCELE) (N/A) HEMORRHOIDECTOMY (N/A)  Patient Location: PACU  Anesthesia Type:General  Level of Consciousness: sedated and patient cooperative  Airway and Oxygen Therapy: Patient Spontanous Breathing and Patient connected to nasal cannula oxygen  Post-op Pain: mild  Post-op Assessment: Post-op Vital signs reviewed, Patient's Cardiovascular Status Stable, Respiratory Function Stable, Patent Airway and No signs of Nausea or vomiting  Post-op Vital Signs: Reviewed and stable  Complications: No apparent anesthesia complications 06/27/12  Patient discharged yesterday afternoon.  No apparent anesthesia complications.

## 2012-06-25 NOTE — H&P (View-Only) (Signed)
Patient ID: Claudia Mcintyre, female   DOB: 05/31/1975, 36 y.o.   MRN: [id number] Pt here today for a pre-op visit for Vaginal Hysterectomy, posterior repair and removal hemorrhoid Claudia Mcintyre is an 36 y.o. female. This is final preoperative evaluation, with vaginal hysterectomy posterior repair plan for 06/25/2012. Patient experiences severe pelvic discomfort related to her second-degree uterine descensus, with uterine retroversion. This also resulted in dyspareunia such that the patient has become abstinent.  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 She has a long external hemorrhoid that will be removed. She's had one prior hemorrhoidectomy and is familiar with the procedure The patient denies significant bladder discomfort, noting nocturia x1, and only occasional stress incontinence with coughing lifting or straining. She's not desirous of addressing the bladder search at this time. Surgical procedure has reviewed  with the patient using visual guide, questions are answered to patient's satisfaction. She has no history of abnormal Paps. Periods are described as heavy particularly last 6 months. Transvaginal ultrasound shows the uterus to be slightly enlarged 8.8 x 7.0 x 6.6 cm retroverted with a intramural fundal fibroid, and a normal-appearing endometrial cavity, adnexa are normal.  Pap smear was repeated today   Pertinent Gynecological History: Menses: regular every 28 days without intermenstrual spotting Bleeding: Heavy, regular lasting 7 days or less Contraception: tubal ligation DES exposure: unknown Blood transfusions: none Sexually transmitted diseases: no past history Previous GYN Procedures: Tubal ligation  Last mammogram: 2 young to be required Date:  Last pap: normal Date: 3 years ago, repeated 06/17/2012 OB History:  G 2, P 2   Menstrual History: Menarche age:  Patient's last menstrual period was 06/08/2012.    History reviewed. No pertinent past medical history.  Past Surgical History  Procedure Laterality Date  . Cholecystectomy    . Tubal ligation    . Spine surgery    . Knee arthroscopy      Family History  Problem Relation Age of Onset  . Heart disease Mother   . Hypertension Mother   . Heart disease Father   . Hypertension Father   . Heart disease Maternal Grandfather   . Heart disease Paternal Grandfather     Social History:  reports that she has never smoked. She has never used smokeless tobacco. She reports that she does not drink alcohol or use illicit drugs.  Allergies: No Known Allergies   (Not in a hospital admission)  ROS denies any heart or lung problems denies asthma denies anesthesia complication  Blood pressure 120/90, height 5' 3" (1.6 m), weight 185 lb 12.8 oz (84.278 kg), last menstrual period 06/08/2012. Physical Exam Physical Examination: General appearance - alert, well appearing, and in no distress, oriented to person, place, and time and normal appearing weight Mental status - alert, oriented to person, place, and time, normal mood, behavior, speech, dress, motor activity, and thought processes Eyes - pupils equal and reactive, extraocular eye movements intact Mouth - mucous membranes moist, pharynx normal without lesions, dental hygiene good and No dentures or partial plate Neck - supple, no significant adenopathy, thyroid exam: thyroid is normal in size without nodules or tenderness Chest - clear to auscultation, no wheezes, rales or rhonchi, symmetric air entry Heart - normal rate and regular rhythm Abdomen - soft, nontender, nondistended, no masses or organomegaly Pelvic - VULVA: normal appearing vulva with no masses, tenderness or   lesions, single large 2 cm long external hemorrhoid desiring removal, VAGINA: , CERVIX: normal appearing cervix without  discharge or lesions, UTERUS: tenderness to movement, , retroverted, enlarged to 810 week's size, mobile, retroverted, ADNEXA: normal adnexa in size, nontender and no masses, RECTAL: rectocele noted , high, approximately 6 cm above the anus Rectal - rectocele Extremities - peripheral pulses normal, no pedal edema, no clubbing or cyanosis, Homan's sign negative bilaterally Skin - normal coloration and turgor, no rashes, no suspicious skin lesions noted   Results for orders placed in visit on 06/17/12 (from the past 24 hour(s))  HEMOCCULT GUIAC POC 1CARD (OFFICE)     Status: None   Collection Time    06/17/12  9:14 AM      Result Value Range   Fecal Occult Blood, POC Negative     Card #1 Date       Card #2 Fecal Occult Blod, POC       Card #2 Date       Card #3 Fecal Occult Blood, POC       Card #3 Date        No results found.  Assessment/Plan: 1 second-degree uterine descensus with uterine retroversion, slight uterine enlargement, with symptomatic rectocele. 2. Dyspareunia Plan: Vaginal hysterectomy posterior repair removal of hemorrhoid Preoperative plans reviewed including labs, bowel prep, and surgery was details reviewed including risks potential complications such as bleeding infection hematoma, need for transfusion or anesthesia risks. Patient acknowledges understanding these inherent risks Angela Platner V 06/17/2012, 9:16 AM  

## 2012-06-26 LAB — BASIC METABOLIC PANEL
BUN: 6 mg/dL (ref 6–23)
Chloride: 104 mEq/L (ref 96–112)
Creatinine, Ser: 0.74 mg/dL (ref 0.50–1.10)
GFR calc Af Amer: >90 mL/min (ref >90)

## 2012-06-26 LAB — CBC
HCT: 30.8 % — ABNORMAL LOW (ref 36.0–46.0)
MCHC: 34.7 g/dL (ref 30.0–36.0)
MCV: 87.5 fL (ref 78.0–100.0)
RDW: 13.2 % (ref 11.5–15.5)
WBC: 16.1 10*3/uL — ABNORMAL HIGH (ref 4.0–10.5)

## 2012-06-26 MED ORDER — IBUPROFEN 600 MG PO TABS: 600.0000 mg | ORAL_TABLET | Freq: Four times a day (QID) | ORAL | Status: DC | PRN

## 2012-06-26 MED ORDER — OXYCODONE-ACETAMINOPHEN 5-325 MG PO TABS: 1.0000 | ORAL_TABLET | ORAL | Status: DC | PRN

## 2012-06-26 MED ORDER — POLYETHYLENE GLYCOL 3350 17 GM/SCOOP PO POWD: 17.0000 g | Freq: Two times a day (BID) | ORAL | Status: DC

## 2012-06-26 NOTE — Progress Notes (Signed)
UR Chart Review Completed  

## 2012-06-26 NOTE — Progress Notes (Signed)
1 Day Post-Op Procedure(s) (LRB): HYSTERECTOMY VAGINAL (N/A) POSTERIOR REPAIR (RECTOCELE) (N/A) HEMORRHOIDECTOMY (N/A)  Subjective: Patient reports incisional pain and tolerating PO.    Objective: I have reviewed patient's vital signs and labs.  General: alert, cooperative and mild distress GI: soft, non-tender; bowel sounds normal; no masses,  no organomegaly Extremities: extremities normal, atraumatic, no cyanosis or edema and Homans sign is negative, no sign of DVT Vaginal Bleeding: minimal  Assessment: s/p Procedure(s): HYSTERECTOMY VAGINAL (N/A) POSTERIOR REPAIR (RECTOCELE) (N/A) HEMORRHOIDECTOMY (N/A): stable and tolerating diet  Plan: Encourage ambulation Discontinue IV fluids Discharge home  LOS: 1 day    Claudia Mcintyre 06/26/2012, 9:23 AM

## 2012-06-26 NOTE — Progress Notes (Signed)
Went over discharge instructions with patient, verbalized understanding. Catheter removed, skin dry and intact. Patient stable and in no acute distress. Discharged via wheelchair.

## 2012-06-26 NOTE — Op Note (Signed)
Patient:  Claudia Mcintyre  DOB:  02/09/1975  MRN:  [id number]   Preop Diagnosis:  Prolapsed hemorrhoid  Postop Diagnosis:  The same  Procedure:  Hemorrhoidectomy  Surgeon:  Dr. Tilford Pillar  Anes:  General endotracheal  Indications:  Patient is a 37 year old female who presented to [facility] for a planned vaginal hysterectomy. During her workup it was noted she had a large prolapsed hemorrhoid. It was discussed with the patient and Dr. Emelda Fear to proceed with hemorrhoidectomy. General surgery consultation was obtained.  Risks benefits and alternatives of hemorrhoidectomy were discussed at length the patient. Her questions and concerns are addressed. Patient does wish to proceed with hemorrhoidectomy.  Procedure note:  Patient was taken to the operating room was placed in supine position the or table time the general anesthetic and that is a Optician, dispensing. Once patient was asleep she symmetrically intubated by the nurse anesthetist. She is in placed into a high lithotomy position in yellow concerns. Her perineum and rectum were prepped with Betadine solution and draped in standard fashion.  Time out was performed.. At this time I performed a digital rectal exam which is noted to be normal. An anal retractor was inserted. The hemorrhoidal tissue was grasped with an Allis clamp. The hemorrhoidal column was excised in a wedge fashion using electrocautery. Upon freeing the hemorrhoidal tissue was placed in the back table and sent as a permanent specimen to pathology. Hemostasis was good. Definitive hemostasis is obtained after closure of the mucosa with a 3-0 chromic suture in a running locking fashion. No additional significant prolapsed hemorrhoidal tissue was encountered. Patient tolerated this portion of the procedure extremely well.  At the completion of the hemorrhoidectomy all instrument, sponge, needle counts were correct. At this time Dr. Emelda Fear proceed with the planned vaginal  hysterectomy. Please see his operative report for the complete description of his portion of the procedure. To note the patient tolerated the hemorrhoidectomy extremely well.  Complications:  None apparent during the hemorrhoidectomy  EBL:  Less than 75 mL  Specimen:  Hemorrhoidal tissue

## 2012-06-26 NOTE — Discharge Summary (Signed)
Physician Discharge Summary  Patient ID: Claudia Mcintyre MRN: [id number] DOB/AGE: 07/03/1975 37 y.o.  Admit date: 06/25/2012 Discharge date: 06/26/2012  Primary Care Physician:  Kimber Relic, MD  Discharge Diagnoses:  s/p Procedure(s):  HYSTERECTOMY VAGINAL (N/A)  POSTERIOR REPAIR (RECTOCELE) (N/A)  HEMORRHOIDECTOMY (N/A): stable and tolerating diet      Active Problems:   * No active hospital problems. *    Medication List    ASK your doctor about these medications       acetaminophen 500 MG tablet  Commonly known as:  TYLENOL  Take 1,500 mg by mouth 2 (two) times daily as needed. For pain     aspirin 81 MG chewable tablet  Chew 81 mg by mouth daily. Pain     fluconazole 150 MG tablet  Commonly known as:  DIFLUCAN  Take 1 tablet (150 mg total) by mouth once. One tablet every 3 days x 2 pills       also given prescriptions for oxycodone, MiraLAX and ibuprofen   Disposition and Follow-up: 2 weeks [facility]  Consults:  general surgery for hemorrhoidectomy Procedures: Vaginal hysterectomy posterior repair-JB Kiarrah Rausch              Hemorrhoidectomy Dr. Leticia Penna  Significant Diagnostic Studies:  No results found.  Brief H and P: For complete details please refer to admission H and P, but in brief was admitted for vaginal hysterectomy posterior repair and hemorrhoidectomy With second-degree uterine descensus uterine retroversion moderate uterine enlargement and dyspareunia. She also had a large hemorrhoid complex on the right posterior side that was handled by Dr. Francoise Schaumann Course:  Active Problems:   * No active hospital problems. *  admitted postop for overnight observation and pain management with vaginal packing in place, managed with PCA pump overnight, which was discontinued the following morning. She tolerated regular diet and postop hemoglobin of 10 and was stable for discharge CBC    Component Value Date/Time   WBC 16.1* 06/26/2012  0512   RBC 3.52* 06/26/2012 0512   HGB 10.7* 06/26/2012 0512   HCT 30.8* 06/26/2012 0512   PLT 272 06/26/2012 0512   MCV 87.5 06/26/2012 0512   MCH 30.4 06/26/2012 0512   MCHC 34.7 06/26/2012 0512   RDW 13.2 06/26/2012 0512   LYMPHSABS 2.8 01/22/2012 1830   MONOABS 0.4 01/22/2012 1830   EOSABS 0.3 01/22/2012 1830   BASOSABS 0.0 01/22/2012 1830     Time spent on Discharge: 20 minute

## 2012-07-01 ENCOUNTER — Encounter (HOSPITAL_COMMUNITY): Payer: Self-pay | Admitting: Obstetrics and Gynecology

## 2012-07-08 ENCOUNTER — Encounter: Payer: Self-pay | Admitting: Obstetrics and Gynecology

## 2012-07-08 ENCOUNTER — Ambulatory Visit (INDEPENDENT_AMBULATORY_CARE_PROVIDER_SITE_OTHER): Payer: 59 | Admitting: Obstetrics and Gynecology

## 2012-07-08 VITALS — BP 120/88 | Ht 63.0 in | Wt 181.0 lb

## 2012-07-08 DIAGNOSIS — Z9889 Other specified postprocedural states: Secondary | ICD-10-CM

## 2012-07-08 NOTE — Progress Notes (Signed)
Patient ID: Claudia Mcintyre, female   DOB: 1975-11-09, 37 y.o.   MRN: [id number] Pt here for her post op for Hysterectomy Vaginal, no complaints or problems at this time.

## 2012-07-08 NOTE — Patient Instructions (Signed)
Ret to wk 07/14/12

## 2012-07-29 ENCOUNTER — Ambulatory Visit: Payer: 59 | Admitting: Women's Health

## 2012-08-13 ENCOUNTER — Encounter: Payer: 59 | Admitting: Obstetrics and Gynecology

## 2012-08-26 ENCOUNTER — Encounter: Payer: 59 | Admitting: Obstetrics and Gynecology

## 2012-11-11 ENCOUNTER — Encounter (HOSPITAL_COMMUNITY): Payer: Self-pay | Admitting: Emergency Medicine

## 2012-11-11 ENCOUNTER — Emergency Department (HOSPITAL_COMMUNITY)
Admission: EM | Admit: 2012-11-11 | Discharge: 2012-11-11 | Disposition: A | Discharge status: Home / Self Care | Payer: 59 | Attending: Emergency Medicine | Admitting: Emergency Medicine

## 2012-11-11 ENCOUNTER — Emergency Department (HOSPITAL_COMMUNITY): Payer: 59

## 2012-11-11 DIAGNOSIS — Z87442 Personal history of urinary calculi: Secondary | ICD-10-CM | POA: Insufficient documentation

## 2012-11-11 DIAGNOSIS — K6289 Other specified diseases of anus and rectum: Secondary | ICD-10-CM | POA: Insufficient documentation

## 2012-11-11 DIAGNOSIS — Z3202 Encounter for pregnancy test, result negative: Secondary | ICD-10-CM | POA: Insufficient documentation

## 2012-11-11 DIAGNOSIS — R110 Nausea: Secondary | ICD-10-CM | POA: Insufficient documentation

## 2012-11-11 DIAGNOSIS — M545 Low back pain, unspecified: Secondary | ICD-10-CM | POA: Insufficient documentation

## 2012-11-11 DIAGNOSIS — Z79899 Other long term (current) drug therapy: Secondary | ICD-10-CM | POA: Insufficient documentation

## 2012-11-11 DIAGNOSIS — M62838 Other muscle spasm: Secondary | ICD-10-CM | POA: Insufficient documentation

## 2012-11-11 HISTORY — DX: Calculus of kidney: N20.0

## 2012-11-11 LAB — URINALYSIS, ROUTINE W REFLEX MICROSCOPIC
Bilirubin Urine: NEGATIVE
Hgb urine dipstick: NEGATIVE
Ketones, ur: NEGATIVE mg/dL
Protein, ur: NEGATIVE mg/dL
Urobilinogen, UA: 1 mg/dL (ref 0.0–1.0)

## 2012-11-11 LAB — CBC WITH DIFFERENTIAL/PLATELET
Basophils Absolute: 0 10*3/uL (ref 0.0–0.1)
Basophils Relative: 0 % (ref 0–1)
Eosinophils Absolute: 0.2 10*3/uL (ref 0.0–0.7)
Eosinophils Relative: 3 % (ref 0–5)
HCT: 37.1 % (ref 36.0–46.0)
MCHC: 36.1 g/dL — ABNORMAL HIGH (ref 30.0–36.0)
Monocytes Absolute: 0.4 10*3/uL (ref 0.1–1.0)
Neutro Abs: 6.2 10*3/uL (ref 1.7–7.7)
RDW: 13.2 % (ref 11.5–15.5)

## 2012-11-11 LAB — LIPASE, BLOOD: Lipase: 16 U/L (ref 11–59)

## 2012-11-11 LAB — COMPREHENSIVE METABOLIC PANEL
ALT: 12 U/L (ref 0–35)
Alkaline Phosphatase: 86 U/L (ref 39–117)
Chloride: 104 mEq/L (ref 96–112)
GFR calc Af Amer: >90 mL/min (ref >90)
GFR calc non Af Amer: >90 mL/min (ref >90)
Total Bilirubin: 0.4 mg/dL (ref 0.3–1.2)

## 2012-11-11 MED ORDER — NAPROXEN 500 MG PO TABS: 500.0000 mg | ORAL_TABLET | Freq: Two times a day (BID) | ORAL | Status: DC

## 2012-11-11 MED ORDER — MORPHINE SULFATE 4 MG/ML IJ SOLN
4.0000 mg | Freq: Once | INTRAMUSCULAR | Status: AC
  Administered 2012-11-11: 4 mg via INTRAVENOUS
  Filled 2012-11-11: qty 1

## 2012-11-11 MED ORDER — METHOCARBAMOL 500 MG PO TABS: 500.0000 mg | ORAL_TABLET | Freq: Two times a day (BID) | ORAL | Status: DC

## 2012-11-11 MED ORDER — ONDANSETRON HCL 4 MG/2ML IJ SOLN
4.0000 mg | Freq: Once | INTRAMUSCULAR | Status: AC
  Administered 2012-11-11: 4 mg via INTRAVENOUS
  Filled 2012-11-11: qty 2

## 2012-11-11 MED ORDER — HYDROCODONE-ACETAMINOPHEN 5-325 MG PO TABS: 1.0000 | ORAL_TABLET | Freq: Four times a day (QID) | ORAL | Status: DC | PRN

## 2012-11-11 NOTE — ED Provider Notes (Signed)
CSN: [id number]     Arrival date & time 11/11/12  1191 History   First MD Initiated Contact with Patient 11/11/12 0940     Chief Complaint  Patient presents with  . Flank Pain   (Consider location/radiation/quality/duration/timing/severity/associated sxs/prior Treatment) HPI Comments: 37 yo with PMH of Lumbar spine surgery L4-L5 discectomy and Kidney stone history when young presents with 1 day h/o R flank pain  and R LBP.  Pt c/o mild nausea.  No f/c, f/u/d, hemaruria, vomiting, diarrhea.  Pt denies trauma.  Pt denies VB of VD.   She is unsure of the cause of her symptoms.  It doesn't feel like a LBP flare.    NKDA MEDS: otc NSAID LMP: s/p Hysterectomy June 2014  Patient is a 37 y.o. female presenting with flank pain. The history is provided by the patient.  Flank Pain This is a new problem. The current episode started yesterday. The problem occurs constantly. The problem has been gradually worsening. Pertinent negatives include no chest pain, no abdominal pain, no headaches and no shortness of breath. The symptoms are aggravated by walking. The symptoms are relieved by lying down. She has tried acetaminophen for the symptoms. The treatment provided mild relief.    Past Medical History  Diagnosis Date  . Kidney stone    Past Surgical History  Procedure Laterality Date  . Cholecystectomy    . Tubal ligation    . Spine surgery    . Knee arthroscopy    . Vaginal hysterectomy N/A 06/25/2012    Procedure: HYSTERECTOMY VAGINAL;  Surgeon: [name], MD;  Location: [facility];  Service: Gynecology;  Laterality: N/A;  . Rectocele repair N/A 06/25/2012    Procedure: POSTERIOR REPAIR (RECTOCELE);  Surgeon: [name], MD;  Location: [facility];  Service: Gynecology;  Laterality: N/A;  . Hemorrhoid surgery N/A 06/25/2012    Procedure: HEMORRHOIDECTOMY;  Surgeon: Fabio Bering, MD;  Location: [facility];  Service: General;  Laterality: N/A;   Family History  Problem Relation Age of Onset  .  Heart disease Mother   . Hypertension Mother   . Heart disease Father   . Hypertension Father   . Heart disease Maternal Grandfather   . Heart disease Paternal Grandfather    History  Substance Use Topics  . Smoking status: Never Smoker   . Smokeless tobacco: Never Used  . Alcohol Use: No   OB History   Grav Para Term Preterm Abortions TAB SAB Ect Mult Living   2 2   0          Review of Systems  Constitutional: Positive for activity change. Negative for fever, chills, diaphoresis, appetite change and fatigue.  HENT: Negative.   Eyes: Negative.   Respiratory: Negative.  Negative for cough, choking, chest tightness and shortness of breath.   Cardiovascular: Negative.  Negative for chest pain.  Gastrointestinal: Positive for nausea and rectal pain. Negative for vomiting, abdominal pain, diarrhea, constipation, blood in stool, abdominal distention and anal bleeding.  Endocrine: Negative.   Genitourinary: Positive for flank pain.  Musculoskeletal: Positive for back pain. Negative for arthralgias, gait problem, joint swelling, myalgias, neck pain and neck stiffness.  Allergic/Immunologic: Negative.   Neurological: Negative.  Negative for dizziness, seizures, syncope, facial asymmetry, speech difficulty, light-headedness, numbness and headaches.       No paralysis or paresthesia, no saddle anesthesia, No B/B incontinence or retention/constipation  Psychiatric/Behavioral: Negative.     Allergies  Review of patient's allergies indicates no known allergies.  Home Medications   Current Outpatient Rx  Name  Route  Sig  Dispense  Refill  . ibuprofen (ADVIL,MOTRIN) 200 MG tablet   Oral   Take 600 mg by mouth every 6 (six) hours as needed for pain.         Marland Kitchen HYDROcodone-acetaminophen (NORCO/VICODIN) 5-325 MG per tablet   Oral   Take 1 tablet by mouth every 6 (six) hours as needed for pain.   6 tablet   0   . methocarbamol (ROBAXIN) 500 MG tablet   Oral   Take 1 tablet (500 mg  total) by mouth 2 (two) times daily.   20 tablet   0   . naproxen (NAPROSYN) 500 MG tablet   Oral   Take 1 tablet (500 mg total) by mouth 2 (two) times daily.   30 tablet   0    BP 108/66  Pulse 72  Temp(Src) 98.2 F (36.8 C) (Oral)  Resp 16  SpO2 99%  LMP 06/08/2012 Physical Exam  Vitals reviewed. Constitutional: She appears well-developed and well-nourished. No distress.  HENT:  Head: Normocephalic and atraumatic.  Eyes: Conjunctivae are normal. Right eye exhibits no discharge. Left eye exhibits no discharge.  Neck: Normal range of motion. Neck supple.  Cardiovascular: Normal rate and regular rhythm.   Pulmonary/Chest: Effort normal and breath sounds normal. No respiratory distress. She has no wheezes. She has no rales.  Abdominal: Soft. Bowel sounds are normal. She exhibits no distension and no mass. There is no hepatosplenomegaly, splenomegaly or hepatomegaly. There is no tenderness. There is no rebound, no guarding and no CVA tenderness. No hernia.  Musculoskeletal: She exhibits tenderness. She exhibits no edema.       Lumbar back: She exhibits tenderness, pain and spasm. She exhibits no bony tenderness, no swelling, no edema, no deformity and no laceration.       Back:  Moving all extremities; Able to SLR, MS 5/5 and DTRs 2/5 in LE; sensation intact  Neurological: She is alert. She has normal reflexes. She displays normal reflexes. She exhibits normal muscle tone. Coordination normal.  Skin: Skin is warm and dry. She is not diaphoretic.    ED Course  Procedures (including critical care time) Labs Review Labs Reviewed  CBC WITH DIFFERENTIAL - Abnormal; Notable for the following:    MCHC 36.1 (*)    All other components within normal limits  COMPREHENSIVE METABOLIC PANEL  LIPASE, BLOOD  URINALYSIS, ROUTINE W REFLEX MICROSCOPIC  PREGNANCY, URINE   Imaging Review Ct Abdomen Pelvis Wo Contrast  11/11/2012   CLINICAL DATA:  flank pain  EXAM: CT ABDOMEN AND PELVIS  WITHOUT CONTRAST  TECHNIQUE: Multidetector CT imaging of the abdomen and pelvis was performed following the standard protocol without oral or intravenous contrast.  COMPARISON:  January 22, 2012  FINDINGS: The lung bases are clear.  No focal liver lesions are identified on this noncontrast enhanced study. The liver is enlarged, measuring 19.4 cm in length. Gallbladder is absent. There is no biliary duct dilatation.  Spleen, pancreas, and adrenals appear normal.  There is a 1 mm calculus in the mid to upper pole left kidney. No calculi are seen in the right kidney. There is no renal mass or hydronephrosis on either side. There is no ureteral calculus or ureterectasis on either side.  In the pelvis, there is no mass or fluid collection. The urinary bladder is midline. Appendix appears normal.  There is no bowel obstruction. There is no free air or portal venous air.  There is no ascites, adenopathy, or abscess in the abdomen or pelvis. Aorta is non aneurysmal. There are no blastic or lytic bone lesions. There is levoscoliosis in the lumbar spine.  IMPRESSION: 1 mm calculus upper to midpole left kidney, nonobstructing. There is no hydronephrosis on either side. There is no ureteral calculus or ureterectasis on either side.  There is no bowel obstruction. No mesenteric inflammation or abscess. Appendix appears normal.  Liver is enlarged but otherwise appears normal. Gallbladder is absent.   Electronically Signed   By: Bretta Bang M.D.   On: 11/11/2012 11:20    EKG Interpretation   None       MDM   1. Lower back pain    37 year old white female presents emergency department with chief complaint of right lower back pain. ER workup negative for kidney stone, UTI, cauda equina syndrome, or other emergent etiology.  Vital signs normal. Benign abdomen.  No pelvic symptoms and pt s/p TAH therefore no pelvic exam.    D/c with symptomatic treatment. ER precautions given. Pt agrees with plan.         Darlys Gales, MD 11/11/12 [phone]

## 2012-11-11 NOTE — ED Notes (Signed)
Patient transported to CT 

## 2012-11-11 NOTE — ED Notes (Signed)
Pt is calling ride from work to come pick her up.

## 2012-11-11 NOTE — ED Notes (Signed)
Back pain that rads to rt abd denies dysuia having nausea hx of k stone

## 2012-11-14 ENCOUNTER — Other Ambulatory Visit: Payer: Self-pay

## 2013-02-14 ENCOUNTER — Ambulatory Visit (INDEPENDENT_AMBULATORY_CARE_PROVIDER_SITE_OTHER): Payer: 59 | Admitting: Internal Medicine

## 2013-02-14 VITALS — BP 120/72 | HR 88 | Temp 98.0°F | Resp 18 | Ht 63.5 in | Wt 195.0 lb

## 2013-02-14 DIAGNOSIS — N644 Mastodynia: Secondary | ICD-10-CM

## 2013-02-14 NOTE — Progress Notes (Signed)
   Subjective:    Patient ID: Claudia Mcintyre, female    DOB: 11-15-1975, 37 y.o.   MRN: [id number] This chart was scribed for Tami Lin, MD by Anastasia Pall, ED Scribe. This patient was seen in room 14 and the patient's care was started at 10:20 AM.  Chief Complaint  Patient presents with  . swelling in left breast    x2 weeks     HPI Claudia Mcintyre is a 38 y.o. female Pt presents with swelling and itching to her left breast, with associated redness, off and on for 2 months. She states that today she scratched the place raw, and reports blood came out of the area due to scratching so hard. She reports her entire left breast is mildly tender. She denies any pain, problems with her right breast. She reports hysterectomy in 06/14, left one ovary. She reports her family has h/o breast cancer. She denies having mammogram. She denies fever, and any other symptoms.   PCP - GREEN, Viviann Spare, MD  There are no active problems to display for this patient.  Prior to Admission medications   Medication Sig Start Date End Date Taking? Authorizing Provider  HYDROcodone-acetaminophen (NORCO/VICODIN) 5-325 MG per tablet Take 1 tablet by mouth every 6 (six) hours as needed for pain. 11/11/12   Elmer Sow, MD  ibuprofen (ADVIL,MOTRIN) 200 MG tablet Take 600 mg by mouth every 6 (six) hours as needed for pain.    Historical Provider, MD  methocarbamol (ROBAXIN) 500 MG tablet Take 1 tablet (500 mg total) by mouth 2 (two) times daily. 11/11/12   Elmer Sow, MD  naproxen (NAPROSYN) 500 MG tablet Take 1 tablet (500 mg total) by mouth 2 (two) times daily. 11/11/12   Elmer Sow, MD    Review of Systems  Constitutional: Negative for fever.  Musculoskeletal:       Pain and swelling over left breast.        Objective:   Physical Exam  Nursing note and vitals reviewed. Constitutional: She is oriented to person, place, and time. She appears well-developed and well-nourished. No distress.    HENT:  Head: Normocephalic and atraumatic.  Eyes: EOM are normal.  Neck: Neck supple. No thyromegaly present.  Cardiovascular: Normal rate.   Pulmonary/Chest: Effort normal. No respiratory distress.  Musculoskeletal: Normal range of motion. She exhibits tenderness.  Tenderness throughout the left breast with mild generalized engorgement but no specific masses. Skin over breast is normal except for one area of excoriation. No axillary lymph nodes. No supracervical lymph nodes.   Lymphadenopathy:    She has no cervical adenopathy.  Neurological: She is alert and oriented to person, place, and time.  Skin: Skin is warm and dry.  Psychiatric: She has a normal mood and affect. Her behavior is normal.    BP 120/72  Pulse 88  Temp(Src) 98 F (36.7 C) (Oral)  Resp 18  Ht 5' 3.5" (1.613 m)  Wt 195 lb (88.451 kg)  BMI 34.00 kg/m2  SpO2 98%  LMP 06/08/2012     Assessment & Plan:  Breast tenderness in female - Plan: US BREAST COMPLETE UNI LEFT INC AXILLA    I have completed the patient encounter in its entirety as documented by the scribe, with editing by me where necessary. Alissia Lory P. Laney Pastor, M.D.

## 2013-02-26 ENCOUNTER — Ambulatory Visit
Admission: RE | Admit: 2013-02-26 | Discharge: 2013-02-26 | Disposition: A | Discharge status: Home / Self Care | Payer: 59 | Source: Ambulatory Visit | Attending: Internal Medicine | Admitting: Internal Medicine

## 2013-02-26 ENCOUNTER — Other Ambulatory Visit: Payer: Self-pay | Admitting: Internal Medicine

## 2013-02-26 DIAGNOSIS — N6310 Unspecified lump in the right breast, unspecified quadrant: Secondary | ICD-10-CM

## 2013-02-26 DIAGNOSIS — N644 Mastodynia: Secondary | ICD-10-CM

## 2013-05-25 ENCOUNTER — Encounter (HOSPITAL_COMMUNITY): Payer: Self-pay | Admitting: Emergency Medicine

## 2013-05-25 ENCOUNTER — Emergency Department (HOSPITAL_COMMUNITY)
Admission: EM | Admit: 2013-05-25 | Discharge: 2013-05-25 | Disposition: A | Discharge status: Home / Self Care | Payer: 59 | Attending: Emergency Medicine | Admitting: Emergency Medicine

## 2013-05-25 DIAGNOSIS — Z79899 Other long term (current) drug therapy: Secondary | ICD-10-CM | POA: Insufficient documentation

## 2013-05-25 DIAGNOSIS — Z791 Long term (current) use of non-steroidal anti-inflammatories (NSAID): Secondary | ICD-10-CM | POA: Insufficient documentation

## 2013-05-25 DIAGNOSIS — Y9389 Activity, other specified: Secondary | ICD-10-CM | POA: Insufficient documentation

## 2013-05-25 DIAGNOSIS — S46912A Strain of unspecified muscle, fascia and tendon at shoulder and upper arm level, left arm, initial encounter: Secondary | ICD-10-CM

## 2013-05-25 DIAGNOSIS — IMO0002 Reserved for concepts with insufficient information to code with codable children: Secondary | ICD-10-CM | POA: Insufficient documentation

## 2013-05-25 DIAGNOSIS — Y929 Unspecified place or not applicable: Secondary | ICD-10-CM | POA: Insufficient documentation

## 2013-05-25 DIAGNOSIS — Z87442 Personal history of urinary calculi: Secondary | ICD-10-CM | POA: Insufficient documentation

## 2013-05-25 DIAGNOSIS — X500XXA Overexertion from strenuous movement or load, initial encounter: Secondary | ICD-10-CM | POA: Insufficient documentation

## 2013-05-25 MED ORDER — METHOCARBAMOL 500 MG PO TABS: 1000.0000 mg | ORAL_TABLET | Freq: Three times a day (TID) | ORAL | Status: DC

## 2013-05-25 MED ORDER — MELOXICAM 7.5 MG PO TABS: ORAL_TABLET | ORAL | Status: DC

## 2013-05-25 NOTE — ED Provider Notes (Signed)
CSN: [id number]     Arrival date & time 05/25/13  1202 History  This chart was scribed for Lily Kocher, PA-C, working with Nat Christen, MD, by [facility] ED Scribe. This patient was seen in room [facility]24/[facility].   Chief Complaint  Patient presents with  . Arm Pain    HPI Comments: Patient is a 38 year old female who presents to the emergency department with pain of the left shoulder extending under the left breast. The patient states that she has been moving tables and items for a yard sale. She now has pain that seems to be getting progressively worse. The patient is also concerned because she had a tick bite approximately 3 weeks ago and she is concerned that the muscle pain could be related to the tick bite. She had a low-grade temperature of 99 recently but has not seen any rash of any kind and not had any other discomfort other than her shoulder. The patient denies any surgery or procedures involving the neck or shoulder.  The history is provided by the patient. No language interpreter was used.    HPI Comments:        Past Medical History  Diagnosis Date  . Kidney stone    Past Surgical History  Procedure Laterality Date  . Cholecystectomy    . Tubal ligation    . Spine surgery    . Knee arthroscopy    . Vaginal hysterectomy N/A 06/25/2012    Procedure: HYSTERECTOMY VAGINAL;  Surgeon: Jonnie Kind, MD;  Location: [facility];  Service: Gynecology;  Laterality: N/A;  . Rectocele repair N/A 06/25/2012    Procedure: POSTERIOR REPAIR (RECTOCELE);  Surgeon: Jonnie Kind, MD;  Location: [facility];  Service: Gynecology;  Laterality: N/A;  . Hemorrhoid surgery N/A 06/25/2012    Procedure: HEMORRHOIDECTOMY;  Surgeon: Donato Heinz, MD;  Location: [facility];  Service: General;  Laterality: N/A;  . Abdominal hysterectomy     Family History  Problem Relation Age of Onset  . Heart disease Mother   . Hypertension Mother   . Cancer Mother   . Diabetes Mother   . Stroke Mother   .  Heart disease Father   . Hypertension Father   . Heart disease Maternal Grandfather   . Heart disease Paternal Grandfather    History  Substance Use Topics  . Smoking status: Never Smoker   . Smokeless tobacco: Never Used  . Alcohol Use: No   OB History   Grav Para Term Preterm Abortions TAB SAB Ect Mult Living   2 2   0          Review of Systems  Constitutional: Negative for activity change.       All ROS Neg except as noted in HPI  HENT: Negative for nosebleeds.   Eyes: Negative for photophobia and discharge.  Respiratory: Negative for cough, shortness of breath and wheezing.   Cardiovascular: Negative for chest pain and palpitations.  Gastrointestinal: Negative for abdominal pain and blood in stool.  Genitourinary: Negative for dysuria, frequency and hematuria.  Musculoskeletal: Positive for back pain. Negative for arthralgias and neck pain.  Skin: Negative.   Neurological: Negative for dizziness, seizures and speech difficulty.  Psychiatric/Behavioral: Negative for hallucinations and confusion.  All other systems reviewed and are negative.   Allergies  Review of patient's allergies indicates no known allergies.  Home Medications   Prior to Admission medications   Medication Sig Start Date End Date Taking? Authorizing Provider  HYDROcodone-acetaminophen (NORCO/VICODIN) 5-325  MG per tablet Take 1 tablet by mouth every 6 (six) hours as needed for pain. 11/11/12   Elmer Sow, MD  ibuprofen (ADVIL,MOTRIN) 200 MG tablet Take 600 mg by mouth every 6 (six) hours as needed for pain.    Historical Provider, MD  methocarbamol (ROBAXIN) 500 MG tablet Take 1 tablet (500 mg total) by mouth 2 (two) times daily. 11/11/12   Elmer Sow, MD  naproxen (NAPROSYN) 500 MG tablet Take 1 tablet (500 mg total) by mouth 2 (two) times daily. 11/11/12   Elmer Sow, MD   Triage Vitals: BP 165/82  Pulse 77  Temp(Src) 99 F (37.2 C) (Oral)  Resp 18  Ht 5\' 3"  (1.6 m)  Wt 185 lb (83.915  kg)  BMI 32.78 kg/m2  SpO2 100%  LMP 06/08/2012  Physical Exam  Nursing note and vitals reviewed. Constitutional: She is oriented to person, place, and time. She appears well-developed and well-nourished.  Non-toxic appearance. No distress.  HENT:  Head: Normocephalic and atraumatic.  Right Ear: Tympanic membrane and external ear normal.  Left Ear: Tympanic membrane and external ear normal.  Eyes: EOM and lids are normal. Pupils are equal, round, and reactive to light.  Neck: Normal range of motion. Neck supple. Carotid bruit is not present. No tracheal deviation present.  Cardiovascular: Normal rate, regular rhythm, normal heart sounds, intact distal pulses and normal pulses.   Pulmonary/Chest: Effort normal and breath sounds normal. No respiratory distress.  There is pain to palpation/range of motion from the upper trapezius to the lower portion of the trapezius and around to the area just under the axilla/ breast area. Chaperone present during exam.  Abdominal: Soft. Bowel sounds are normal. There is no tenderness. There is no guarding.  Musculoskeletal: Normal range of motion.  Lymphadenopathy:       Head (right side): No submandibular adenopathy present.       Head (left side): No submandibular adenopathy present.    She has no cervical adenopathy.  Neurological: She is alert and oriented to person, place, and time. She has normal strength. No cranial nerve deficit or sensory deficit.  Skin: Skin is warm and dry.  No rash.  No evidence of residual tick bite on the back.  Psychiatric: She has a normal mood and affect. Her speech is normal and behavior is normal.    ED Course  Procedures (including critical care time)  DIAGNOSTIC STUDIES: Oxygen Saturation is 100% on RA, normal by my interpretation.    COORDINATION OF CARE: 1:30 PM- Pt advised of plan for treatment and pt agrees.  Labs Review Labs Reviewed - No data to display  Imaging Review No results found.   EKG  Interpretation None      MDM The temperature is 90.9, the blood pressure is 165/82, otherwise the vital signs are within normal limits. The pulse oximetry is 100% on room air. There is no evidence of any residual tick bite or foreign body in the skin on the back. The examination of the shoulder in the area under the breast is consistent with muscle strain.  The plan at this time is for the patient be treated with Robaxin and Mobic. The patient is to follow up with her primary physician if not improving.    Final diagnoses:  None    *I have reviewed nursing notes, vital signs, and all appropriate lab and imaging results for this patient.Lenox Ahr, PA-C 05/25/13 1402

## 2013-05-25 NOTE — ED Provider Notes (Signed)
Medical screening examination/treatment/procedure(s) were performed by non-physician practitioner and as supervising physician I was immediately available for consultation/collaboration.   EKG Interpretation None       Nat Christen, MD 05/25/13 1513

## 2013-05-25 NOTE — Discharge Instructions (Signed)
Please use your sling for the next 5-7 days. Please use Mobic 2 times daily with food until all taken. Use Robaxin 3 times daily for spasm pain. Robaxin may cause drowsiness, please use with caution. Please followup with Dr. Nyoka Cowden if not improving. Shoulder Sprain A shoulder sprain is the result of damage to the tough, fiber-like tissues (ligaments) that help hold your shoulder in place. The ligaments may be stretched or torn. Besides the main shoulder joint (the ball and socket), there are several smaller joints that connect the bones in this area. A sprain usually involves one of those joints. Most often it is the acromioclavicular (or AC) joint. That is the joint that connects the collarbone (clavicle) and the shoulder blade (scapula) at the top point of the shoulder blade (acromion). A shoulder sprain is a mild form of what is called a shoulder separation. Recovering from a shoulder sprain may take some time. For some, pain lingers for several months. Most people recover without long term problems. CAUSES   A shoulder sprain is usually caused by some kind of trauma. This might be:  Falling on an outstretched arm.  Being hit hard on the shoulder.  Twisting the arm.  Shoulder sprains are more likely to occur in people who:  Play sports.  Have balance or coordination problems. SYMPTOMS   Pain when you move your shoulder.  Limited ability to move the shoulder.  Swelling and tenderness on top of the shoulder.  Redness or warmth in the shoulder.  Bruising.  A change in the shape of the shoulder. DIAGNOSIS  Your healthcare provider may:  Ask about your symptoms.  Ask about recent activity that might have caused those symptoms.  Examine your shoulder. You may be asked to do simple exercises to test movement. The other shoulder will be examined for comparison.  Order some tests that provide a look inside the body. They can show the extent of the injury. The tests could  include:  X-rays.  CT (computed tomography) scan.  MRI (magnetic resonance imaging) scan. RISKS AND COMPLICATIONS  Loss of full shoulder motion.  Ongoing shoulder pain. TREATMENT  How long it takes to recover from a shoulder sprain depends on how severe it was. Treatment options may include:  Rest. You should not use the arm or shoulder until it heals.  Ice. For 2 or 3 days after the injury, put an ice pack on the shoulder up to 4 times a day. It should stay on for 15 to 20 minutes each time. Wrap the ice in a towel so it does not touch your skin.  Over-the-counter medicine to relieve pain.  A sling or brace. This will keep the arm still while the shoulder is healing.  Physical therapy or rehabilitation exercises. These will help you regain strength and motion. Ask your healthcare provider when it is OK to begin these exercises.  Surgery. The need for surgery is rare with a sprained shoulder, but some people may need surgery to keep the joint in place and reduce pain. HOME CARE INSTRUCTIONS   Ask your healthcare provider about what you should and should not do while your shoulder heals.  Make sure you know how to apply ice to the correct area of your shoulder.  Talk with your healthcare provider about which medications should be used for pain and swelling.  If rehabilitation therapy will be needed, ask your healthcare provider to refer you to a therapist. If it is not recommended, then ask about at-home  exercises. Find out when exercise should begin. SEEK MEDICAL CARE IF:  Your pain, swelling, or redness at the joint increases. SEEK IMMEDIATE MEDICAL CARE IF:   You have a fever.  You cannot move your arm or shoulder. Document Released: 05/14/2008 Document Revised: 03/20/2011 Document Reviewed: 05/14/2008 [facility] Patient Information 2014 [place], [place].

## 2013-05-25 NOTE — ED Notes (Signed)
Pt reports lymph node had been swollen under left arm for the past 2 weeks.  Also reports got a tick off of her back 3 weeks ago.  Pt says was moving some light tables yesterday morning and started having pain in back radiating around to left breast that afternoon.  Also c/o pain in left shoulder and left arm.  Pt says hurts to raise left arm.  Reports noticed left hand swelling.  Pt has low grade temp or 99 orally.

## 2013-09-01 ENCOUNTER — Ambulatory Visit: Payer: 59 | Admitting: Family Medicine

## 2013-10-19 ENCOUNTER — Emergency Department (HOSPITAL_COMMUNITY): Payer: 59

## 2013-10-19 ENCOUNTER — Emergency Department (HOSPITAL_COMMUNITY)
Admission: EM | Admit: 2013-10-19 | Discharge: 2013-10-19 | Disposition: A | Discharge status: Home / Self Care | Payer: 59 | Attending: Emergency Medicine | Admitting: Emergency Medicine

## 2013-10-19 ENCOUNTER — Encounter (HOSPITAL_COMMUNITY): Payer: Self-pay | Admitting: Emergency Medicine

## 2013-10-19 DIAGNOSIS — Z9851 Tubal ligation status: Secondary | ICD-10-CM | POA: Insufficient documentation

## 2013-10-19 DIAGNOSIS — Z87442 Personal history of urinary calculi: Secondary | ICD-10-CM | POA: Insufficient documentation

## 2013-10-19 DIAGNOSIS — Z9049 Acquired absence of other specified parts of digestive tract: Secondary | ICD-10-CM | POA: Diagnosis not present

## 2013-10-19 DIAGNOSIS — R112 Nausea with vomiting, unspecified: Secondary | ICD-10-CM | POA: Diagnosis not present

## 2013-10-19 DIAGNOSIS — N201 Calculus of ureter: Secondary | ICD-10-CM | POA: Insufficient documentation

## 2013-10-19 DIAGNOSIS — Z90710 Acquired absence of both cervix and uterus: Secondary | ICD-10-CM | POA: Diagnosis not present

## 2013-10-19 DIAGNOSIS — R1012 Left upper quadrant pain: Secondary | ICD-10-CM | POA: Diagnosis present

## 2013-10-19 LAB — URINALYSIS, ROUTINE W REFLEX MICROSCOPIC
BILIRUBIN URINE: NEGATIVE
GLUCOSE, UA: NEGATIVE mg/dL
Leukocytes, UA: NEGATIVE
Nitrite: NEGATIVE
Specific Gravity, Urine: >1.03 — ABNORMAL HIGH (ref 1.005–1.030)
Urobilinogen, UA: 0.2 mg/dL (ref 0.0–1.0)
pH: 6 (ref 5.0–8.0)

## 2013-10-19 LAB — CBC WITH DIFFERENTIAL/PLATELET
Basophils Absolute: 0 10*3/uL (ref 0.0–0.1)
Basophils Relative: 0 % (ref 0–1)
Eosinophils Absolute: 0.7 10*3/uL (ref 0.0–0.7)
Eosinophils Relative: 6 % — ABNORMAL HIGH (ref 0–5)
HEMATOCRIT: 39.2 % (ref 36.0–46.0)
Hemoglobin: 13.7 g/dL (ref 12.0–15.0)
LYMPHS PCT: 23 % (ref 12–46)
Lymphs Abs: 2.7 10*3/uL (ref 0.7–4.0)
MCH: 29.1 pg (ref 26.0–34.0)
MCHC: 34.9 g/dL (ref 30.0–36.0)
MCV: 83.2 fL (ref 78.0–100.0)
MONO ABS: 0.5 10*3/uL (ref 0.1–1.0)
Monocytes Relative: 4 % (ref 3–12)
NEUTROS ABS: 8 10*3/uL — AB (ref 1.7–7.7)
Neutrophils Relative %: 67 % (ref 43–77)
Platelets: 380 10*3/uL (ref 150–400)
RBC: 4.71 MIL/uL (ref 3.87–5.11)
RDW: 13.7 % (ref 11.5–15.5)
WBC: 11.8 10*3/uL — ABNORMAL HIGH (ref 4.0–10.5)

## 2013-10-19 LAB — COMPREHENSIVE METABOLIC PANEL
ALT: 18 U/L (ref 0–35)
AST: 21 U/L (ref 0–37)
Albumin: 4.4 g/dL (ref 3.5–5.2)
Alkaline Phosphatase: 111 U/L (ref 39–117)
Anion gap: 15 (ref 5–15)
BILIRUBIN TOTAL: 0.5 mg/dL (ref 0.3–1.2)
BUN: 12 mg/dL (ref 6–23)
CHLORIDE: 105 meq/L (ref 96–112)
CO2: 22 meq/L (ref 19–32)
Calcium: 9.6 mg/dL (ref 8.4–10.5)
Creatinine, Ser: 0.9 mg/dL (ref 0.50–1.10)
GFR, EST NON AFRICAN AMERICAN: 80 mL/min — AB (ref >90)
Glucose, Bld: 126 mg/dL — ABNORMAL HIGH (ref 70–99)
Potassium: 3.9 mEq/L (ref 3.7–5.3)
SODIUM: 142 meq/L (ref 137–147)
Total Protein: 8.2 g/dL (ref 6.0–8.3)

## 2013-10-19 LAB — URINE MICROSCOPIC-ADD ON

## 2013-10-19 LAB — LIPASE, BLOOD: Lipase: 14 U/L (ref 11–59)

## 2013-10-19 MED ORDER — SODIUM CHLORIDE 0.9 % IV SOLN
1000.0000 mL | Freq: Once | INTRAVENOUS | Status: AC
  Administered 2013-10-19: 1000 mL via INTRAVENOUS

## 2013-10-19 MED ORDER — ONDANSETRON HCL 4 MG/2ML IJ SOLN
4.0000 mg | Freq: Once | INTRAMUSCULAR | Status: AC
  Administered 2013-10-19: 4 mg via INTRAVENOUS
  Filled 2013-10-19: qty 2

## 2013-10-19 MED ORDER — SODIUM CHLORIDE 0.9 % IV SOLN: 1000.0000 mL | INTRAVENOUS | Status: DC

## 2013-10-19 MED ORDER — IOHEXOL 300 MG/ML  SOLN
100.0000 mL | Freq: Once | INTRAMUSCULAR | Status: AC | PRN
  Administered 2013-10-19: 100 mL via INTRAVENOUS

## 2013-10-19 MED ORDER — HYDROMORPHONE HCL 1 MG/ML IJ SOLN
1.0000 mg | Freq: Once | INTRAMUSCULAR | Status: AC
  Administered 2013-10-19: 1 mg via INTRAVENOUS
  Filled 2013-10-19: qty 1

## 2013-10-19 MED ORDER — OXYCODONE-ACETAMINOPHEN 5-325 MG PO TABS: 1.0000 | ORAL_TABLET | ORAL | Status: DC | PRN

## 2013-10-19 MED ORDER — KETOROLAC TROMETHAMINE 30 MG/ML IJ SOLN
30.0000 mg | Freq: Once | INTRAMUSCULAR | Status: AC
  Administered 2013-10-19: 30 mg via INTRAVENOUS
  Filled 2013-10-19: qty 1

## 2013-10-19 MED ORDER — IOHEXOL 300 MG/ML  SOLN
50.0000 mL | Freq: Once | INTRAMUSCULAR | Status: AC | PRN
  Administered 2013-10-19: 50 mL via ORAL

## 2013-10-19 MED ORDER — ONDANSETRON HCL 4 MG PO TABS: 4.0000 mg | ORAL_TABLET | Freq: Four times a day (QID) | ORAL | Status: DC

## 2013-10-19 MED ORDER — HYDROMORPHONE HCL 1 MG/ML IJ SOLN
1.0000 mg | Freq: Once | INTRAMUSCULAR | Status: AC
Start: 2013-10-19 — End: 2013-10-19
  Administered 2013-10-19: 1 mg via INTRAVENOUS
  Filled 2013-10-19: qty 1

## 2013-10-19 NOTE — ED Provider Notes (Signed)
CSN: [id number]     Arrival date & time 10/19/13  1829 History   First MD Initiated Contact with Patient 10/19/13 0831     Chief Complaint  Patient presents with  . Abdominal Pain     (Consider location/radiation/quality/duration/timing/severity/associated sxs/prior Treatment) Patient is a 38 y.o. female presenting with abdominal pain. The history is provided by the patient. No language interpreter was used.  Abdominal Pain Pain location:  L flank and LUQ Pain quality: sharp and shooting   Pain severity:  Severe Onset quality:  Gradual Duration:  1 day Timing:  Constant Progression:  Worsening Associated symptoms: nausea and vomiting   Associated symptoms: no chills, no dysuria, no fever and no hematuria   Associated symptoms comment:  Pain that started in left upper abdomen radiating into left flank last night. Pain has been persistent throughout the night, worsening over time. It is associated with nausea and vomiting. No fever, change in bowel habits. She reports a history of kidney stones, and no history of pancreatitis. She admits only to occasional alcohol use but also that she was at a high school reunion last night and had about 4 drinks only.   Past Medical History  Diagnosis Date  . Kidney stone    Past Surgical History  Procedure Laterality Date  . Cholecystectomy    . Tubal ligation    . Spine surgery    . Knee arthroscopy    . Vaginal hysterectomy N/A 06/25/2012    Procedure: HYSTERECTOMY VAGINAL;  Surgeon: Jonnie Kind, MD;  Location: [facility];  Service: Gynecology;  Laterality: N/A;  . Rectocele repair N/A 06/25/2012    Procedure: POSTERIOR REPAIR (RECTOCELE);  Surgeon: Jonnie Kind, MD;  Location: [facility];  Service: Gynecology;  Laterality: N/A;  . Hemorrhoid surgery N/A 06/25/2012    Procedure: HEMORRHOIDECTOMY;  Surgeon: Donato Heinz, MD;  Location: [facility];  Service: General;  Laterality: N/A;  . Abdominal hysterectomy     Family History  Problem  Relation Age of Onset  . Heart disease Mother   . Hypertension Mother   . Cancer Mother   . Diabetes Mother   . Stroke Mother   . Heart disease Father   . Hypertension Father   . Heart disease Maternal Grandfather   . Heart disease Paternal Grandfather    History  Substance Use Topics  . Smoking status: Never Smoker   . Smokeless tobacco: Never Used  . Alcohol Use: No     Comment: "occasionally"   OB History   Grav Para Term Preterm Abortions TAB SAB Ect Mult Living   2 2   0          Review of Systems  Constitutional: Negative for fever and chills.  HENT: Negative.   Respiratory: Negative.   Cardiovascular: Negative.   Gastrointestinal: Positive for nausea, vomiting and abdominal pain.  Genitourinary: Positive for flank pain. Negative for dysuria and hematuria.  Musculoskeletal: Negative.   Skin: Negative.   Neurological: Negative.       Allergies  Review of patient's allergies indicates no known allergies.  Home Medications   Prior to Admission medications   Not on File   BP 125/78  Pulse 72  Temp(Src) 98.4 F (36.9 C) (Oral)  Resp 20  Ht 5\' 3"  (1.6 m)  Wt 185 lb (83.915 kg)  BMI 32.78 kg/m2  SpO2 100%  LMP 06/08/2012 Physical Exam  Constitutional: She is oriented to person, place, and time. She appears well-developed and well-nourished.  HENT:  Head: Normocephalic.  Neck: Normal range of motion. Neck supple.  Cardiovascular: Normal rate and regular rhythm.   Pulmonary/Chest: Effort normal and breath sounds normal.  Abdominal: Soft. Bowel sounds are normal. She exhibits no distension. There is tenderness. There is no rebound and no guarding.  LUQ abdominal tenderness to soft abdomen.  Genitourinary:  Left flank tenderness.   Musculoskeletal: Normal range of motion.  Neurological: She is alert and oriented to person, place, and time.  Skin: Skin is warm and dry. No rash noted.  Psychiatric: She has a normal mood and affect.    ED Course   Procedures (including critical care time) Labs Review Labs Reviewed  CBC WITH DIFFERENTIAL - Abnormal; Notable for the following:    WBC 11.8 (*)    Neutro Abs 8.0 (*)    Eosinophils Relative 6 (*)    All other components within normal limits  COMPREHENSIVE METABOLIC PANEL - Abnormal; Notable for the following:    Glucose, Bld 126 (*)    GFR calc non Af Amer 80 (*)    All other components within normal limits  URINALYSIS, ROUTINE W REFLEX MICROSCOPIC - Abnormal; Notable for the following:    APPearance CLOUDY (*)    Specific Gravity, Urine >1.030 (*)    Hgb urine dipstick LARGE (*)    Ketones, ur TRACE (*)    Protein, ur TRACE (*)    All other components within normal limits  URINE MICROSCOPIC-ADD ON - Abnormal; Notable for the following:    Bacteria, UA FEW (*)    All other components within normal limits  LIPASE, BLOOD    Imaging Review Ct Abdomen Pelvis W Contrast  10/19/2013   CLINICAL DATA:  Acute onset left flank pain yesterday, now with a generalized abdominal pain. Urinary urgency. Nausea and vomiting. Surgical history includes cholecystectomy and hysterectomy.  EXAM: CT ABDOMEN AND PELVIS WITH CONTRAST  TECHNIQUE: Multidetector CT imaging of the abdomen and pelvis was performed using the standard protocol following bolus administration of intravenous contrast.  CONTRAST:  160mL OMNIPAQUE IOHEXOL 300 MG/ML IV. Oral contrast was also administered.  COMPARISON:  Multiple prior CT abdomen and pelvis examinations dating back to 10/21/2005, most recently 11/11/2012.  FINDINGS: 2 x 4 mm calculus in the proximal left ureter causing moderate left hydronephrosis. No urinary tract calculi elsewhere on either side. Lobular contour of both kidneys is unchanged and consistent with persistent fetal lobulation. No focal parenchymal abnormality involving either kidney.  Normal appearing liver, spleen, pancreas, and adrenal glands. Gallbladder surgically absent. No unexpected biliary ductal  dilation. No visible aortoiliofemoral atherosclerosis. Patent visceral arteries. No significant lymphadenopathy.  Normal-appearing stomach filled with oral contrast. Normal-appearing small bowel. Expected stool burden throughout the normal appearing colon. Normal appendix in the right upper pelvis. No ascites.  Uterus surgically absent. Bilateral tubal ligation clips. Approximate 4.1 x 3.3 x 3.2 cm simple cyst arising from the right ovary. Small follicular cysts in the left ovary. No free pelvic fluid. Urinary bladder decompressed and unremarkable. Phleboliths low in the pelvis.  Bone window images demonstrate bone island in the left pubic bone at the symphysis, slight lumbar scoliosis convex left, and mild facet degenerative changes at L4-5 and L5-S1. Visualized lung bases clear. Heart size normal.  IMPRESSION: 1. Obstructing approximate 4 mm calculus in the proximal left ureter. 2. No urinary tract calculi elsewhere on either side. 3. Approximate 4 cm simple cyst arising from the right ovary. This does not require further imaging followup. This recommendation follows ACR  consensus guidelines: White Paper of the ACR Incidental Findings Committee II on Adnexal Findings. J Am Coll Radiol [phone].   Electronically Signed   By: Evangeline Dakin M.D.   On: 10/19/2013 10:50     EKG Interpretation None      MDM   Final diagnoses:  None    1. Ureteral stone, proximal  CT evidence of 4 mm stone on left with normal lab studies, including lipase and LFT's. No significant leukocytosis. Pain is controlled, no further vomiting. Stable for discharge home. Referral to urology provided. Return precautions discussed.    Dewaine Oats, PA-C 10/19/13 1153

## 2013-10-19 NOTE — Discharge Instructions (Signed)

## 2013-10-19 NOTE — ED Notes (Signed)
Pt states she was at a highschool reunion last night when she had the onset of abdominal pain. Patient stated that she was unable to sit due to pain. Pt tearful in triage.. Patient states that since the pain has started she "feels like she needs to go, but she can't". Patient states pain is "past a 10". Pt states pain is located all over abdomen and wraps around to her back. Pt states she has been having N/V since pain has started but has been unable to use the bathroom.

## 2013-10-19 NOTE — ED Provider Notes (Deleted)
.  atsu   Maudry Diego, MD 10/19/13 [phone]

## 2013-10-22 ENCOUNTER — Ambulatory Visit (HOSPITAL_BASED_OUTPATIENT_CLINIC_OR_DEPARTMENT_OTHER)
Admission: RE | Admit: 2013-10-22 | Discharge: 2013-10-22 | Disposition: A | Discharge status: Home Health | Payer: 59 | Source: Ambulatory Visit | Attending: Urology | Admitting: Urology

## 2013-10-22 ENCOUNTER — Encounter (HOSPITAL_BASED_OUTPATIENT_CLINIC_OR_DEPARTMENT_OTHER)
Admission: RE | Disposition: A | Discharge status: Home Health | Payer: Self-pay | Source: Ambulatory Visit | Attending: Urology

## 2013-10-22 ENCOUNTER — Encounter (HOSPITAL_BASED_OUTPATIENT_CLINIC_OR_DEPARTMENT_OTHER): Payer: 59 | Admitting: Anesthesiology

## 2013-10-22 ENCOUNTER — Encounter (HOSPITAL_BASED_OUTPATIENT_CLINIC_OR_DEPARTMENT_OTHER): Payer: Self-pay

## 2013-10-22 ENCOUNTER — Other Ambulatory Visit: Payer: Self-pay | Admitting: Urology

## 2013-10-22 ENCOUNTER — Ambulatory Visit (HOSPITAL_BASED_OUTPATIENT_CLINIC_OR_DEPARTMENT_OTHER): Payer: 59 | Admitting: Anesthesiology

## 2013-10-22 DIAGNOSIS — N201 Calculus of ureter: Secondary | ICD-10-CM

## 2013-10-22 HISTORY — PX: CYSTOSCOPY WITH RETROGRADE PYELOGRAM, URETEROSCOPY AND STENT PLACEMENT: SHX5789

## 2013-10-22 SURGERY — CYSTOURETEROSCOPY, WITH RETROGRADE PYELOGRAM AND STENT INSERTION
Anesthesia: General | Site: Ureter | Laterality: Left

## 2013-10-22 MED ORDER — OXYCODONE HCL 5 MG PO TABS
5.0000 mg | ORAL_TABLET | Freq: Once | ORAL | Status: DC | PRN
  Filled 2013-10-22: qty 1

## 2013-10-22 MED ORDER — LIDOCAINE HCL (CARDIAC) 20 MG/ML IV SOLN
INTRAVENOUS | Status: DC | PRN
  Administered 2013-10-22: 80 mg via INTRAVENOUS

## 2013-10-22 MED ORDER — CEFAZOLIN SODIUM-DEXTROSE 2-3 GM-% IV SOLR
INTRAVENOUS | Status: DC | PRN
  Administered 2013-10-22: 2 g via INTRAVENOUS

## 2013-10-22 MED ORDER — PROMETHAZINE HCL 25 MG/ML IJ SOLN
6.2500 mg | INTRAMUSCULAR | Status: DC | PRN
  Filled 2013-10-22: qty 1

## 2013-10-22 MED ORDER — ACETAMINOPHEN 325 MG PO TABS
650.0000 mg | ORAL_TABLET | ORAL | Status: DC | PRN
  Filled 2013-10-22: qty 2

## 2013-10-22 MED ORDER — IOHEXOL 350 MG/ML SOLN
INTRAVENOUS | Status: DC | PRN
  Administered 2013-10-22: 5 mL

## 2013-10-22 MED ORDER — MIDAZOLAM HCL 5 MG/5ML IJ SOLN
INTRAMUSCULAR | Status: DC | PRN
  Administered 2013-10-22: 2 mg via INTRAVENOUS

## 2013-10-22 MED ORDER — CEPHALEXIN 250 MG PO CAPS: 500.0000 mg | ORAL_CAPSULE | Freq: Two times a day (BID) | ORAL | Status: DC

## 2013-10-22 MED ORDER — CEFAZOLIN SODIUM 1-5 GM-% IV SOLN
1.0000 g | INTRAVENOUS | Status: DC
  Filled 2013-10-22: qty 50

## 2013-10-22 MED ORDER — FENTANYL CITRATE 0.05 MG/ML IJ SOLN
INTRAMUSCULAR | Status: AC
  Filled 2013-10-22: qty 4

## 2013-10-22 MED ORDER — METOCLOPRAMIDE HCL 5 MG/ML IJ SOLN
INTRAMUSCULAR | Status: DC | PRN
  Administered 2013-10-22: 10 mg via INTRAVENOUS

## 2013-10-22 MED ORDER — SODIUM CHLORIDE 0.9 % IJ SOLN
3.0000 mL | Freq: Two times a day (BID) | INTRAMUSCULAR | Status: DC
  Filled 2013-10-22: qty 3

## 2013-10-22 MED ORDER — ACETAMINOPHEN 10 MG/ML IV SOLN
INTRAVENOUS | Status: DC | PRN
  Administered 2013-10-22: 1000 mg via INTRAVENOUS

## 2013-10-22 MED ORDER — PROPOFOL 10 MG/ML IV BOLUS
INTRAVENOUS | Status: DC | PRN
  Administered 2013-10-22: 200 mg via INTRAVENOUS

## 2013-10-22 MED ORDER — MIDAZOLAM HCL 2 MG/2ML IJ SOLN
INTRAMUSCULAR | Status: AC
  Filled 2013-10-22: qty 2

## 2013-10-22 MED ORDER — OXYCODONE HCL 5 MG PO TABS
5.0000 mg | ORAL_TABLET | ORAL | Status: DC | PRN
  Administered 2013-10-22: 5 mg via ORAL
  Filled 2013-10-22: qty 2

## 2013-10-22 MED ORDER — STERILE WATER FOR IRRIGATION IR SOLN
Status: DC | PRN
  Administered 2013-10-22: 500 mL

## 2013-10-22 MED ORDER — ACETAMINOPHEN 650 MG RE SUPP
650.0000 mg | RECTAL | Status: DC | PRN
  Filled 2013-10-22: qty 1

## 2013-10-22 MED ORDER — MEPERIDINE HCL 25 MG/ML IJ SOLN
6.2500 mg | INTRAMUSCULAR | Status: DC | PRN
  Filled 2013-10-22: qty 1

## 2013-10-22 MED ORDER — MORPHINE SULFATE 2 MG/ML IJ SOLN
2.0000 mg | INTRAMUSCULAR | Status: DC | PRN
  Filled 2013-10-22: qty 1

## 2013-10-22 MED ORDER — FENTANYL CITRATE 0.05 MG/ML IJ SOLN
INTRAMUSCULAR | Status: DC | PRN
  Administered 2013-10-22 (×2): 50 ug via INTRAVENOUS

## 2013-10-22 MED ORDER — LACTATED RINGERS IV SOLN
INTRAVENOUS | Status: DC
  Administered 2013-10-22: 12:00:00 via INTRAVENOUS
  Filled 2013-10-22: qty 1000

## 2013-10-22 MED ORDER — SODIUM CHLORIDE 0.9 % IJ SOLN
3.0000 mL | INTRAMUSCULAR | Status: DC | PRN
  Filled 2013-10-22: qty 3

## 2013-10-22 MED ORDER — CEFAZOLIN SODIUM-DEXTROSE 2-3 GM-% IV SOLR
2.0000 g | INTRAVENOUS | Status: DC
  Filled 2013-10-22: qty 50

## 2013-10-22 MED ORDER — OXYCODONE HCL 5 MG/5ML PO SOLN
5.0000 mg | Freq: Once | ORAL | Status: DC | PRN
  Filled 2013-10-22: qty 5

## 2013-10-22 MED ORDER — CEFAZOLIN SODIUM-DEXTROSE 2-3 GM-% IV SOLR
INTRAVENOUS | Status: AC
  Filled 2013-10-22: qty 50

## 2013-10-22 MED ORDER — SODIUM CHLORIDE 0.9 % IR SOLN
Status: DC | PRN
  Administered 2013-10-22: 4000 mL

## 2013-10-22 MED ORDER — HYDROMORPHONE HCL 1 MG/ML IJ SOLN
0.2500 mg | INTRAMUSCULAR | Status: DC | PRN
  Filled 2013-10-22: qty 1

## 2013-10-22 MED ORDER — DEXAMETHASONE SODIUM PHOSPHATE 4 MG/ML IJ SOLN
INTRAMUSCULAR | Status: DC | PRN
  Administered 2013-10-22: 10 mg via INTRAVENOUS

## 2013-10-22 MED ORDER — OXYCODONE HCL 5 MG PO TABS
ORAL_TABLET | ORAL | Status: AC
  Filled 2013-10-22: qty 1

## 2013-10-22 MED ORDER — SODIUM CHLORIDE 0.9 % IV SOLN
250.0000 mL | INTRAVENOUS | Status: DC | PRN
  Filled 2013-10-22: qty 250

## 2013-10-22 SURGICAL SUPPLY — 25 items
ADAPTER CATH URET PLST 4-6FR (CATHETERS) IMPLANT
ADPR CATH URET STRL DISP 4-6FR (CATHETERS)
BAG DRAIN URO-CYSTO SKYTR STRL (DRAIN) ×3 IMPLANT
BAG DRN UROCATH (DRAIN) ×1
BASKET ZERO TIP NITINOL 2.4FR (BASKET) ×2 IMPLANT
BSKT STON RTRVL ZERO TP 2.4FR (BASKET) ×1
CANISTER SUCT LVC 12 LTR MEDI- (MISCELLANEOUS) ×2 IMPLANT
CATH INTERMIT  6FR 70CM (CATHETERS) ×2 IMPLANT
CLOTH BEACON ORANGE TIMEOUT ST (SAFETY) ×3 IMPLANT
DRAPE CAMERA CLOSED 9X96 (DRAPES) ×3 IMPLANT
GLOVE BIO SURGEON STRL SZ8 (GLOVE) ×3 IMPLANT
GLOVE SURG SS PI 7.5 STRL IVOR (GLOVE) ×4 IMPLANT
GOWN PREVENTION PLUS LG XLONG (DISPOSABLE) ×3 IMPLANT
GOWN STRL REIN XL XLG (GOWN DISPOSABLE) ×3 IMPLANT
GOWN STRL REUS W/ TWL XL LVL3 (GOWN DISPOSABLE) IMPLANT
GOWN STRL REUS W/TWL XL LVL3 (GOWN DISPOSABLE) ×3
GUIDEWIRE 0.038 PTFE COATED (WIRE) IMPLANT
GUIDEWIRE ANG ZIPWIRE 038X150 (WIRE) IMPLANT
GUIDEWIRE STR DUAL SENSOR (WIRE) ×2 IMPLANT
IV NS 1000ML (IV SOLUTION) ×3
IV NS 1000ML BAXH (IV SOLUTION) IMPLANT
IV NS IRRIG 3000ML ARTHROMATIC (IV SOLUTION) ×3 IMPLANT
NS IRRIG 500ML POUR BTL (IV SOLUTION) IMPLANT
PACK CYSTO (CUSTOM PROCEDURE TRAY) ×3 IMPLANT
WATER STERILE IRR 500ML POUR (IV SOLUTION) ×2 IMPLANT

## 2013-10-22 NOTE — Anesthesia Preprocedure Evaluation (Signed)
Anesthesia Evaluation  Patient identified by MRN, date of birth, ID band Patient awake    Reviewed: Allergy & Precautions, H&P , NPO status , Patient's Chart, lab work & pertinent test results  Airway Mallampati: I TM Distance: >3 FB Neck ROM: Full    Dental  (+) Teeth Intact   Pulmonary neg pulmonary ROS,  breath sounds clear to auscultation        Cardiovascular negative cardio ROS  Rhythm:Regular Rate:Normal     Neuro/Psych negative neurological ROS  negative psych ROS   GI/Hepatic negative GI ROS, Neg liver ROS,   Endo/Other  negative endocrine ROS  Renal/GU Renal disease     Musculoskeletal negative musculoskeletal ROS (+)   Abdominal   Peds  Hematology negative hematology ROS (+)   Anesthesia Other Findings   Reproductive/Obstetrics negative OB ROS                           Anesthesia Physical  Anesthesia Plan  ASA: I  Anesthesia Plan: General   Post-op Pain Management:    Induction: Intravenous  Airway Management Planned: LMA  Additional Equipment:   Intra-op Plan:   Post-operative Plan: Extubation in OR  Informed Consent: I have reviewed the patients History and Physical, chart, labs and discussed the procedure including the risks, benefits and alternatives for the proposed anesthesia with the patient or authorized representative who has indicated his/her understanding and acceptance.     Plan Discussed with:   Anesthesia Plan Comments:         Anesthesia Quick Evaluation

## 2013-10-22 NOTE — Anesthesia Postprocedure Evaluation (Signed)
  Anesthesia Post-op Note  Patient: Claudia Mcintyre  Procedure(s) Performed: Procedure(s) (LRB): CYSTOSCOPY WITH RETROGRADE PYELOGRAM, STONE BASKETRY (Left)  Patient Location: PACU  Anesthesia Type: General  Level of Consciousness: awake and alert   Airway and Oxygen Therapy: Patient Spontanous Breathing  Post-op Pain: mild  Post-op Assessment: Post-op Vital signs reviewed, Patient's Cardiovascular Status Stable, Respiratory Function Stable, Patent Airway and No signs of Nausea or vomiting  Last Vitals:  Filed Vitals:   10/22/13 1345  BP: 126/72  Pulse: 81  Temp:   Resp: 18    Post-op Vital Signs: stable   Complications: No apparent anesthesia complications

## 2013-10-22 NOTE — Anesthesia Procedure Notes (Signed)
Procedure Name: LMA Insertion Date/Time: 10/22/2013 12:50 PM Performed by: Mechele Claude Pre-anesthesia Checklist: Patient identified, Emergency Drugs available, Suction available and Patient being monitored Patient Re-evaluated:Patient Re-evaluated prior to inductionOxygen Delivery Method: Circle System Utilized Preoxygenation: Pre-oxygenation with 100% oxygen Intubation Type: IV induction Ventilation: Mask ventilation without difficulty LMA: LMA inserted LMA Size: 4.0 Number of attempts: 1 Airway Equipment and Method: bite block Placement Confirmation: positive ETCO2 Tube secured with: Tape Dental Injury: Teeth and Oropharynx as per pre-operative assessment

## 2013-10-22 NOTE — Transfer of Care (Signed)
Immediate Anesthesia Transfer of Care Note  Patient: Claudia Mcintyre  Procedure(s) Performed: Procedure(s) (LRB): CYSTOSCOPY WITH RETROGRADE PYELOGRAM, URETEROSCOPY AND STENT PLACEMENT (Left)  Patient Location: PACU  Anesthesia Type: General  Level of Consciousness: awake, alert  and oriented  Airway & Oxygen Therapy: Patient Spontanous Breathing and Patient connected to face mask oxygen  Post-op Assessment: Report given to PACU RN and Post -op Vital signs reviewed and stable  Post vital signs: Reviewed and stable  Complications: No apparent anesthesia complications

## 2013-10-22 NOTE — Op Note (Signed)
PATIENT:  Claudia Mcintyre DIAGNOSIS: Left proximal ureteral stone  POST-OPERATIVE DIAGNOSIS: Same  PROCEDURE: Cystoscopy, left retrograde ureteropyelograms with interpretive fluoroscopy, left ureteroscopic stone extraction  SURGEON:  Lillette Boxer. Diahn Waidelich, M.D.  ANESTHESIA:  General  EBL:  Minimal  DRAINS: None  LOCAL MEDICATIONS USED:  None  SPECIMEN:  Stone, patient's daughter  INDICATION: Claudia Mcintyre is a 38 year old female with a significantly symptomatic proximal left ureteral stone. She has not been able to be kept comfortable at home with oral narcotics, and presented to the office earlier today with pain, nausea and vomiting. She presents at this time for ureteroscopic management of the stone  Description of procedure: The patient was properly identified and marked (if applicable) in the holding area. They were then  taken to the operating room and placed on the table in a supine position. General anesthesia was then administered. Once fully anesthetized the patient was moved to the dorsolithotomy position and the genitalia and perineum were sterilely prepped and draped in standard fashion. An official timeout was then performed.  A 22 French panendoscope was advanced into the bladder. Bladder was inspected circumferentially. No tumors trabeculations or foreign bodies were noted. Both ureteral orifices were normal in configuration and location. The left ureteral orifice was cannulated with a 6 [place] open-ended catheter. Retrograde was performed.  This revealed a normal appearing ureter up to the more proximal ureter where a small filling defect in proximal hydro-ureter nephrosis was noted. Pyelocalyceal system was distended but without filling defects.  A sensor-tip guidewire was advanced through the open-ended catheter and into the left renal pelvis past the stone. The scope was removed. I dilated the entire ureter first with intercourse and in the  whole ureteral access catheter. The catheter was removed. The 6 French rigid ureteroscope was advanced under direct vision through the ureter. The stone was snared with the Nitinol basket upon identification. It was easily removed and saved. Repeat ureteroscopy revealed no further ureteral stones or significant lesions. I then dilated the entire left ureter with the inner core the access sheath for approximately 2 minutes. I did not leave a stent in. The access sheath core was removed, the bladder drained and the procedure terminated. The patient was awakened and taken to the [facility] in stable condition. She tolerated the procedure well.   PLAN OF CARE: Discharge to home after PACU  PATIENT DISPOSITION:  PACU - hemodynamically stable.

## 2013-10-22 NOTE — H&P (Signed)
H&P  Chief Complaint: Kidney stone  History of Present Illness: Claudia Mcintyre is a 38 y.o. year old female Who presented to my office this morning with a 3 day history of significant left flank pain, persistent nausea and vomiting from a 3 mm left proximal ureteral stone.  Despite adequate oxycodone, she still has breakthrough pain.  She's had no fever or chills.  Because of her persistent symptomatic issues, she presents at this time for ureteroscopic stone extraction.  Past Medical History  Diagnosis Date  . Kidney stone     Past Surgical History  Procedure Laterality Date  . Cholecystectomy    . Tubal ligation    . Spine surgery    . Knee arthroscopy    . Vaginal hysterectomy N/A 06/25/2012    Procedure: HYSTERECTOMY VAGINAL;  Surgeon: Jonnie Kind, MD;  Location: [facility];  Service: Gynecology;  Laterality: N/A;  . Rectocele repair N/A 06/25/2012    Procedure: POSTERIOR REPAIR (RECTOCELE);  Surgeon: Jonnie Kind, MD;  Location: [facility];  Service: Gynecology;  Laterality: N/A;  . Hemorrhoid surgery N/A 06/25/2012    Procedure: HEMORRHOIDECTOMY;  Surgeon: Donato Heinz, MD;  Location: [facility];  Service: General;  Laterality: N/A;  . Abdominal hysterectomy      Home Medications:  Medications Prior to Admission  Medication Sig Dispense Refill  . ondansetron (ZOFRAN) 4 MG tablet Take 1 tablet (4 mg total) by mouth every 6 (six) hours.  12 tablet  0  . oxyCODONE-acetaminophen (PERCOCET/ROXICET) 5-325 MG per tablet Take 1-2 tablets by mouth every 4 (four) hours as needed for severe pain.  15 tablet  0    Allergies: No Known Allergies  Family History  Problem Relation Age of Onset  . Heart disease Mother   . Hypertension Mother   . Cancer Mother   . Diabetes Mother   . Stroke Mother   . Heart disease Father   . Hypertension Father   . Heart disease Maternal Grandfather   . Heart disease Paternal Grandfather     Social History:  reports that she has never smoked.  She has never used smokeless tobacco. She reports that she drinks about .6 ounces of alcohol per week. She reports that she does not use illicit drugs.  ROS: A complete review of systems was performed.  All systems are negative except for pertinent findings as noted.  Physical Exam:  Vital signs in last 24 hours: Temp:  [97.7 F (36.5 C)] 97.7 F (36.5 C) (10/14 1131) Pulse Rate:  [59] 59 (10/14 1131) Resp:  [16] 16 (10/14 1131) BP: (139)/(67) 139/67 mmHg (10/14 1131) SpO2:  [100 %] 100 % (10/14 1131) Weight:  [83.915 kg (185 lb)] 83.915 kg (185 lb) (10/14 1131) General:  Alert and oriented, No acute distress HEENT: Normocephalic, atraumatic Neck: No JVD or lymphadenopathy Cardiovascular: Regular rate and rhythm Lungs: Clear bilaterally Abdomen: Soft, Obese, left CVA and left lower quadrant tenderness.  No rebound or guarding. Extremities: No edema Neurologic: Grossly intact  Laboratory Data:  No results found for this or any previous visit (from the past 24 hour(s)). No results found for this or any previous visit (from the past 240 hour(s)). Creatinine:  Recent Labs  10/19/13 0853  CREATININE 0.90    Radiologic Imaging: No results found.  Impression/Assessment:  3 mm left ureteral stone  Plan:  Cystoscopy, left retrograde, left ureteroscopic stone extraction with probable stent.  Jorja Loa 10/22/2013, 12:30 PM  Lillette Boxer. Vick Filter MD

## 2013-10-22 NOTE — Discharge Instructions (Signed)
POSTOPERATIVE CARE AFTER URETEROSCOPY  Stent management  *Stents are often left in after ureteroscopy and stone treatment. If left in, they often cause urinary frequency, urgency, occasional blood in the urine, as well as flank discomfort with urination. These are all expected issues, and should resolve after the stent is removed. *Often times, a small thread is left on the end of the stent, and brought out through the urethra. If so, this is used to remove the stent, making it unnecessary to look in the bladder with a scope in the office to remove the stent. If a thread is left on, did not pull on it until instructed.  Diet  Once you have adequately recovered from anesthesia, you may gradually advance your diet, as tolerated, to your regular diet.  Activities  You may gradually increase your activities to your normal unrestricted level the day following your procedure.  Medications  You should resume all preoperative medications. If you are on aspirin-like compounds, you should not resume these until the blood clears from your urine. If given an antibiotic by the surgeon, take these until they are completed. You may also be given, if you have a stent, medications to decrease the urinary frequency and urgency.  Pain  After ureteroscopy, there may be some pain on the side of the scope. Take your pain medicine for this. Usually, this pain resolves within a day or 2.  Fever  Please report any fever over 100 to the doctor.   Post Anesthesia Home Care Instructions  Activity: Get plenty of rest for the remainder of the day. A responsible adult should stay with you for 24 hours following the procedure.  For the next 24 hours, DO NOT: -Drive a car -Paediatric nurse -Drink alcoholic beverages -Take any medication unless instructed by your physician -Make any legal decisions or sign important papers.  Meals: Start with liquid foods such as gelatin or soup. Progress to regular foods as  tolerated. Avoid greasy, spicy, heavy foods. If nausea and/or vomiting occur, drink only clear liquids until the nausea and/or vomiting subsides. Call your physician if vomiting continues.  Special Instructions/Symptoms: Your throat may feel dry or sore from the anesthesia or the breathing tube placed in your throat during surgery. If this causes discomfort, gargle with warm salt water. The discomfort should disappear within 24 hours.

## 2013-10-23 ENCOUNTER — Encounter (HOSPITAL_BASED_OUTPATIENT_CLINIC_OR_DEPARTMENT_OTHER): Payer: Self-pay | Admitting: Urology

## 2013-10-28 ENCOUNTER — Ambulatory Visit: Payer: 59 | Admitting: Urology

## 2013-10-29 NOTE — ED Provider Notes (Signed)
Medical screening examination/treatment/procedure(s) were performed by non-physician practitioner and as supervising physician I was immediately available for consultation/collaboration.   EKG Interpretation None        Maudry Diego, MD 10/29/13 1558

## 2013-11-10 ENCOUNTER — Encounter (HOSPITAL_BASED_OUTPATIENT_CLINIC_OR_DEPARTMENT_OTHER): Payer: Self-pay | Admitting: Urology

## 2013-12-02 ENCOUNTER — Ambulatory Visit: Payer: 59 | Admitting: Urology

## 2013-12-19 ENCOUNTER — Other Ambulatory Visit: Payer: Self-pay | Admitting: Internal Medicine

## 2013-12-19 DIAGNOSIS — N6489 Other specified disorders of breast: Secondary | ICD-10-CM

## 2014-09-18 ENCOUNTER — Ambulatory Visit (INDEPENDENT_AMBULATORY_CARE_PROVIDER_SITE_OTHER): Payer: Commercial Managed Care - HMO

## 2014-09-18 ENCOUNTER — Ambulatory Visit (INDEPENDENT_AMBULATORY_CARE_PROVIDER_SITE_OTHER): Payer: Commercial Managed Care - HMO | Admitting: Emergency Medicine

## 2014-09-18 VITALS — BP 126/66 | HR 71 | Temp 97.5°F | Resp 14 | Ht 63.5 in | Wt 184.8 lb

## 2014-09-18 DIAGNOSIS — R200 Anesthesia of skin: Secondary | ICD-10-CM

## 2014-09-18 DIAGNOSIS — R208 Other disturbances of skin sensation: Secondary | ICD-10-CM | POA: Diagnosis not present

## 2014-09-18 DIAGNOSIS — M25522 Pain in left elbow: Secondary | ICD-10-CM

## 2014-09-18 MED ORDER — GABAPENTIN 300 MG PO CAPS: ORAL_CAPSULE | ORAL | Status: DC

## 2014-09-18 MED ORDER — PREDNISONE 10 MG PO TABS: ORAL_TABLET | ORAL | Status: DC

## 2014-09-18 NOTE — Progress Notes (Addendum)
Patient ID: Claudia Mcintyre, female   DOB: 1975/07/10, 39 y.o.   MRN: [id number]    This chart was scribed for Darlyne Russian, MD by Ladene Artist, ED Scribe. The patient was seen in room 9. Patient's care was started at 2:41 PM.   Chief Complaint:  Chief Complaint  Patient presents with  . Elbow Pain    Radiates to back x 6 days, left elbow    HPI: Claudia Mcintyre is a 39 y.o. female who reports to [facility] today complaining of sudden onset of constant elbow pain for the past 6 days. Pt states that she was lying on her couch 6 days ago while lying wit her left arm bent underneath her head, when she felt a sharp pain that radiates into her left axilla and back. 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 She reports unchanged weakness in her left arm. Pt has tried wrapping a towel around her arm while sleeping.   Past Medical History  Diagnosis Date  . Kidney stone    Past Surgical History  Procedure Laterality Date  . Cholecystectomy    . Tubal ligation    . Spine surgery    . Knee arthroscopy    . Vaginal hysterectomy N/A 06/25/2012    Procedure: HYSTERECTOMY VAGINAL;  Surgeon: Jonnie Kind, MD;  Location: [facility];  Service: Gynecology;  Laterality: N/A;  . Rectocele repair N/A 06/25/2012    Procedure: POSTERIOR REPAIR (RECTOCELE);  Surgeon: Jonnie Kind, MD;  Location: [facility];  Service: Gynecology;  Laterality: N/A;  . Hemorrhoid surgery N/A 06/25/2012    Procedure: HEMORRHOIDECTOMY;  Surgeon: Donato Heinz, MD;  Location: [facility];  Service: General;  Laterality: N/A;  . Abdominal hysterectomy    . Cystoscopy with retrograde pyelogram, ureteroscopy and stent placement Left 10/22/2013    Procedure: CYSTOSCOPY WITH RETROGRADE PYELOGRAM, STONE BASKETRY;  Surgeon: Jorja Loa, MD;  Location: [facility];  Service: Urology;  Laterality: Left;   Social History    Social History  . Marital Status: Divorced    Spouse Name: N/A  . Number of Children: N/A  . Years of Education: N/A   Social History Main Topics  . Smoking status: Never Smoker   . Smokeless tobacco: Never Used  . Alcohol Use: 0.6 oz/week    1 Glasses of wine per week     Comment: "occasionally"  . Drug Use: No  . Sexual Activity: Yes    Birth Control/ Protection: Surgical   Other Topics Concern  . None   Social History Narrative   Family History  Problem Relation Age of Onset  . Heart disease Mother   . Hypertension Mother   . Cancer Mother   . Diabetes Mother   . Stroke Mother   . Heart disease Father   . Hypertension Father   . Heart disease Maternal Grandfather   . Heart disease Paternal Grandfather    No Known Allergies Prior to Admission medications   Medication Sig Start Date End Date Taking? Authorizing Provider  IBUPROFEN PO Take by mouth.   Yes Historical Provider, MD  cephALEXin (KEFLEX) 250 MG capsule Take 2 capsules (500 mg total) by mouth 2 (two) times daily. Patient not taking: Reported on 09/18/2014 10/22/13   Franchot Gallo, MD  ondansetron (ZOFRAN) 4 MG tablet Take 1 tablet (4 mg total) by mouth every 6 (six) hours. Patient not taking:  Reported on 09/18/2014 10/19/13   Charlann Lange, PA-C  oxyCODONE-acetaminophen (PERCOCET/ROXICET) 5-325 MG per tablet Take 1-2 tablets by mouth every 4 (four) hours as needed for severe pain. Patient not taking: Reported on 09/18/2014 10/19/13   Charlann Lange, PA-C     ROS: The patient denies fevers, chills, night sweats, unintentional weight loss, chest pain, palpitations, wheezing, dyspnea on exertion, nausea, vomiting, abdominal pain, dysuria, hematuria, melena, numbness, or tingling. + arthralgias, + weakness   All other systems have been reviewed and were otherwise negative with the exception of those mentioned in the HPI and as above.    PHYSICAL EXAM: Filed Vitals:   09/18/14 1423  BP: 126/66  Pulse: 71   Temp: 97.5 F (36.4 C)  Resp: 14   Body mass index is 32.22 kg/(m^2).   General: Alert, no acute distress HEENT:  Normocephalic, atraumatic, oropharynx patent. Eye: Juliette Mangle [facility] Cardiovascular:  Regular rate and rhythm, no rubs murmurs or gallops.  No Carotid bruits, radial pulse intact. No pedal edema.  Respiratory: Clear to auscultation bilaterally.  No wheezes, rales, or rhonchi.  No cyanosis, no use of accessory musculature Abdominal: No organomegaly, abdomen is soft and non-tender, positive bowel sounds.  No masses. Musculoskeletal: Gait intact. No edema. Exquisitely tender over the ulnar nerve groove at the L elbow. Positive tinnels at this area. Skin: No rashes. Neurologic: Facial musculature symmetric. Decreased grip strength and extension of the fingers. Decreased sensation ulnar and radial nerve distribution.  Psychiatric: Patient acts appropriately throughout our interaction. Lymphatic: No cervical or submandibular lymphadenopathy Genitourinary/Anorectal: No acute findings  LABS: Results for orders placed or performed during the hospital encounter of 10/19/13  CBC with Differential  Result Value Ref Range   WBC 11.8 (H) 4.0 - 10.5 K/uL   RBC 4.71 3.87 - 5.11 MIL/uL   Hemoglobin 13.7 12.0 - 15.0 g/dL   HCT 39.2 36.0 - 46.0 %   MCV 83.2 78.0 - 100.0 fL   MCH 29.1 26.0 - 34.0 pg   MCHC 34.9 30.0 - 36.0 g/dL   RDW 13.7 11.5 - 15.5 %   Platelets 380 150 - 400 K/uL   Neutrophils Relative % 67 43 - 77 %   Neutro Abs 8.0 (H) 1.7 - 7.7 K/uL   Lymphocytes Relative 23 12 - 46 %   Lymphs Abs 2.7 0.7 - 4.0 K/uL   Monocytes Relative 4 3 - 12 %   Monocytes Absolute 0.5 0.1 - 1.0 K/uL   Eosinophils Relative 6 (H) 0 - 5 %   Eosinophils Absolute 0.7 0.0 - 0.7 K/uL   Basophils Relative 0 0 - 1 %   Basophils Absolute 0.0 0.0 - 0.1 K/uL  Comprehensive metabolic panel  Result Value Ref Range   Sodium 142 137 - 147 mEq/L   Potassium 3.9 3.7 - 5.3 mEq/L   Chloride 105 96 - 112  mEq/L   CO2 22 19 - 32 mEq/L   Glucose, Bld 126 (H) 70 - 99 mg/dL   BUN 12 6 - 23 mg/dL   Creatinine, Ser 0.90 0.50 - 1.10 mg/dL   Calcium 9.6 8.4 - 10.5 mg/dL   Total Protein 8.2 6.0 - 8.3 g/dL   Albumin 4.4 3.5 - 5.2 g/dL   AST 21 0 - 37 U/L   ALT 18 0 - 35 U/L   Alkaline Phosphatase 111 39 - 117 U/L   Total Bilirubin 0.5 0.3 - 1.2 mg/dL   GFR calc non Af Amer 80 (L) >90 mL/min   GFR  calc Af Amer >90 >90 mL/min   Anion gap 15 5 - 15  Urinalysis, Routine w reflex microscopic  Result Value Ref Range   Color, Urine YELLOW YELLOW   APPearance CLOUDY (A) CLEAR   Specific Gravity, Urine >1.030 (H) 1.005 - 1.030   pH 6.0 5.0 - 8.0   Glucose, UA NEGATIVE NEGATIVE mg/dL   Hgb urine dipstick LARGE (A) NEGATIVE   Bilirubin Urine NEGATIVE NEGATIVE   Ketones, ur TRACE (A) NEGATIVE mg/dL   Protein, ur TRACE (A) NEGATIVE mg/dL   Urobilinogen, UA 0.2 0.0 - 1.0 mg/dL   Nitrite NEGATIVE NEGATIVE   Leukocytes, UA NEGATIVE NEGATIVE  Lipase, blood  Result Value Ref Range   Lipase 14 11 - 59 U/L  Urine microscopic-add on  Result Value Ref Range   Squamous Epithelial / LPF RARE RARE   WBC, UA 0-2 <3 WBC/hpf   RBC / HPF 21-50 <3 RBC/hpf   Bacteria, UA FEW (A) RARE   Urine-Other MUCOUS PRESENT      EKG/XRAY:   Primary read interpreted by Dr. Everlene Farrier at [facility]. 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 It was difficult to get views of the odontoid.   ASSESSMENT/PLAN:  Patient appears to have compression at the cubital fossa. I'm not quite sure but she seems to have symptoms in both the ulnar and the radial nerve. I placed her on a prednisone taper along with Neurontin to take at night. Referral made to neurology for testing.  Gross sideeffects, risk and benefits, and alternatives of medications d/w patient. Patient is aware that all medications have potential sideeffects and we are unable to predict every sideeffect or drug-drug  interaction that may occur.  Arlyss Queen MD 09/18/2014 2:41 PM

## 2014-09-18 NOTE — Patient Instructions (Signed)

## 2014-10-02 ENCOUNTER — Encounter: Payer: Self-pay | Admitting: Diagnostic Neuroimaging

## 2014-10-02 ENCOUNTER — Ambulatory Visit (INDEPENDENT_AMBULATORY_CARE_PROVIDER_SITE_OTHER): Payer: Commercial Managed Care - HMO | Admitting: Diagnostic Neuroimaging

## 2014-10-02 VITALS — BP 127/71 | HR 77 | Ht 63.0 in | Wt 184.0 lb

## 2014-10-02 DIAGNOSIS — R202 Paresthesia of skin: Secondary | ICD-10-CM

## 2014-10-02 DIAGNOSIS — R200 Anesthesia of skin: Secondary | ICD-10-CM

## 2014-10-02 DIAGNOSIS — G5622 Lesion of ulnar nerve, left upper limb: Secondary | ICD-10-CM | POA: Diagnosis not present

## 2014-10-02 DIAGNOSIS — M79602 Pain in left arm: Secondary | ICD-10-CM

## 2014-10-02 NOTE — Patient Instructions (Addendum)
Thank you for coming to see [place] at [facility]. I hope we have been able to provide you high quality care today.  You may receive a patient satisfaction survey over the next few weeks. We would appreciate your feedback and comments so that we may continue to improve ourselves and the health of our patients.   - I will check EMG/NCS (electrical nerve testing) and lab testing. - Continue gabapentin and ibuprofen.   DR. Gladstone Lighter BRIEF GUIDE TO HAPPY AND HEALTHY LIVING These are some of my general health and wellness recommendations. Some of them may apply to you better than others. Please use common sense as you try these suggestions and feel free to ask me any questions.   ACTIVITY/FITNESS Mental, social, emotional and physical stimulation are very important for brain and body health. Try learning a new activity (arts, music, language, sports, games).  Keep moving your body to the best of your abilities. You can do this at home, the gym, community center, park or anywhere you like. Consider the app Sworkit to help get started. Fitness trackers such as smart-watches, smart-phones or Fitbits can help as well.   NUTRITION Eat more plants: colorful vegetables, nuts, seeds and berries.  Eat less sugar, salt, preservatives and processed foods.  Avoid toxins such as cigarettes and alcohol.  Drink water when you are thirsty. A lemon slice in warm water is an excellent drink when you first wake up.  Consider reviewing these excellent nutrition summaries at the website [employer]: WindowBlog.ch   RELAXATION Consider practicing mindfulness meditation or other relaxation techniques such as deep breathing, prayer, yoga, tai chi, massage. Consider the app Headspace to help get started.   SLEEP Try to get at least 7-8+ hours sleep per day. Regular exercise and reduced caffeine will help you sleep better.   PLANNING Prepare estate  planning, living will, healthcare POA documents. Sometimes this is best planned with the help of an attorney. Theconversationproject.org and agingwithdignity.org are excellent resources.

## 2014-10-02 NOTE — Progress Notes (Signed)
[facility]  PATIENT: Claudia Mcintyre DOB: Feb 14, 1975  REFERRING CLINICIAN: Daub HISTORY FROM: patient  REASON FOR VISIT: new consult    HISTORICAL  CHIEF COMPLAINT:  Chief Complaint  Patient presents with  . Referral    internal    HISTORY OF PRESENT ILLNESS:   39 year old right-handed female here for evaluation of left arm numbness and pain. Patient has history of left arm fracture in her humerus and left wrist at age 62 years old, with no significant sequelae.  Early September 2016 patient was laying on her sofa, on her left side with her left arm bent propping up her head, when all of a sudden she had sharp shooting pain from her left elbow into her left hand. She had pain radiating up her left arm into her armpit region. She had numbness and tingling in digits 4 and 5. Patient rubbed and massaged her arm, shook her arm, but symptoms continued. She went to see PCP who prescribed prednisone which helped relieve some symptoms. She was also prescribed gabapentin which has helped produce numbness and tingling at nighttime. She also takes ibuprofen which is helping a little bit. Symptoms are still persistent. Symptoms fluctuate depending on her arm position and activity level. She has noted some "swelling" in her left elbow, left upper arm and left axilla region. She denies any significant neck pain. No problems with the right arm or legs. No vision changes, headaches, slurred speech or trouble talking.   REVIEW OF SYSTEMS: Full 14 system review of systems performed and notable only for joint pain joint swelling allergies runny nose.   ALLERGIES: No Known Allergies  HOME MEDICATIONS: Outpatient Prescriptions Prior to Visit  Medication Sig Dispense Refill  . gabapentin (NEURONTIN) 300 MG capsule Take 1 capsule nightly 90 capsule 3  . IBUPROFEN PO Take by mouth.    . predniSONE (DELTASONE) 10 MG tablet Take 4 a day for 3 days 3 a day for 3 days 2 a day for 3  days one a day for 3 days 30 tablet 0  . cephALEXin (KEFLEX) 250 MG capsule Take 2 capsules (500 mg total) by mouth 2 (two) times daily. (Patient not taking: Reported on 10/02/2014) 10 capsule 0  . ondansetron (ZOFRAN) 4 MG tablet Take 1 tablet (4 mg total) by mouth every 6 (six) hours. (Patient not taking: Reported on 10/02/2014) 12 tablet 0  . oxyCODONE-acetaminophen (PERCOCET/ROXICET) 5-325 MG per tablet Take 1-2 tablets by mouth every 4 (four) hours as needed for severe pain. (Patient not taking: Reported on 10/02/2014) 15 tablet 0   No facility-administered medications prior to visit.    PAST MEDICAL HISTORY: Past Medical History  Diagnosis Date  . Kidney stone   . Numbness and tingling in left arm     PAST SURGICAL HISTORY: Past Surgical History  Procedure Laterality Date  . Cholecystectomy    . Tubal ligation    . Spine surgery    . Knee arthroscopy    . Vaginal hysterectomy N/A 06/25/2012    Procedure: HYSTERECTOMY VAGINAL;  Surgeon: Jonnie Kind, MD;  Location: [facility];  Service: Gynecology;  Laterality: N/A;  . Rectocele repair N/A 06/25/2012    Procedure: POSTERIOR REPAIR (RECTOCELE);  Surgeon: Jonnie Kind, MD;  Location: [facility];  Service: Gynecology;  Laterality: N/A;  . Hemorrhoid surgery N/A 06/25/2012    Procedure: HEMORRHOIDECTOMY;  Surgeon: Donato Heinz, MD;  Location: [facility];  Service: General;  Laterality: N/A;  . Abdominal hysterectomy    .  Cystoscopy with retrograde pyelogram, ureteroscopy and stent placement Left 10/22/2013    Procedure: CYSTOSCOPY WITH RETROGRADE PYELOGRAM, STONE BASKETRY;  Surgeon: Jorja Loa, MD;  Location: [facility];  Service: Urology;  Laterality: Left;    FAMILY HISTORY: Family History  Problem Relation Age of Onset  . Heart disease Mother   . Hypertension Mother   . Cancer Mother   . Diabetes Mother   . Stroke Mother   . Heart disease Father   . Hypertension Father   . Heart disease Maternal  Grandfather   . Heart disease Paternal Grandfather     SOCIAL HISTORY:  Social History   Social History  . Marital Status: Divorced    Spouse Name: N/A  . Number of Children: N/A  . Years of Education: N/A   Occupational History  . Not on file.   Social History Main Topics  . Smoking status: Never Smoker   . Smokeless tobacco: Never Used  . Alcohol Use: 0.6 oz/week    1 Glasses of wine per week     Comment: "occasionally"  . Drug Use: No  . Sexual Activity: Yes    Birth Control/ Protection: Surgical   Other Topics Concern  . Not on file   Social History Narrative     PHYSICAL EXAM  GENERAL EXAM/CONSTITUTIONAL: Vitals:  Filed Vitals:   10/02/14 0843  BP: 127/71  Pulse: 77  Height: 5\' 3"  (1.6 m)  Weight: 184 lb (83.462 kg)     Body mass index is 32.6 kg/(m^2).  No exam data present  Patient is in no distress; well developed, nourished and groomed; neck is supple  CARDIOVASCULAR:  Examination of carotid arteries is normal; no carotid bruits  Regular rate and rhythm, no murmurs  Examination of peripheral vascular system by observation and palpation is normal  EYES:  Ophthalmoscopic exam of optic discs and posterior segments is normal; no papilledema or hemorrhages  MUSCULOSKELETAL:  Gait, strength, tone, movements noted in Neurologic exam below  NEUROLOGIC: MENTAL STATUS:  No flowsheet data found.  awake, alert, oriented to person, place and time  recent and remote memory intact  normal attention and concentration  language fluent, comprehension intact, naming intact,   fund of knowledge appropriate  CRANIAL NERVE:   2nd - no papilledema on fundoscopic exam  2nd, 3rd, 4th, 6th - pupils equal and reactive to light, visual fields full to confrontation, extraocular muscles intact, no nystagmus  5th - facial sensation symmetric  7th - facial strength symmetric  8th - hearing intact  9th - palate elevates symmetrically, uvula  midline  11th - shoulder shrug symmetric  12th - tongue protrusion midline  MOTOR:   normal bulk and tone, full strength in the RUE, BLE; LUE LIMITED BY PAIN, WITH WEAKNESS IN DELTOID, BICEP, TRICEP, FINGER FLEXION, FINGER ABDUCTION, THUMB ABDUCTION  SENSORY:   normal and symmetric to light touch, temperature, vibration; DECR PP IN LEFT C8-T1 DERMATOMES  COORDINATION:   finger-nose-finger, fine finger movements normal  REFLEXES:   deep tendon reflexes present and symmetric  GAIT/STATION:   narrow based gait; able to walk on toes, heels and tandem; romberg is negative    DIAGNOSTIC DATA (LABS, IMAGING, TESTING) - I reviewed patient records, labs, notes, testing and imaging myself where available.  Lab Results  Component Value Date   WBC 11.8* 10/19/2013   HGB 13.7 10/19/2013   HCT 39.2 10/19/2013   MCV 83.2 10/19/2013   PLT 380 10/19/2013  Component Value Date/Time   NA 142 10/19/2013 0853   K 3.9 10/19/2013 0853   CL 105 10/19/2013 0853   CO2 22 10/19/2013 0853   GLUCOSE 126* 10/19/2013 0853   BUN 12 10/19/2013 0853   CREATININE 0.90 10/19/2013 0853   CALCIUM 9.6 10/19/2013 0853   PROT 8.2 10/19/2013 0853   ALBUMIN 4.4 10/19/2013 0853   AST 21 10/19/2013 0853   ALT 18 10/19/2013 0853   ALKPHOS 111 10/19/2013 0853   BILITOT 0.5 10/19/2013 0853   GFRNONAA 80* 10/19/2013 0853   GFRAA >90 10/19/2013 0853   No results found for: CHOL, HDL, LDLCALC, LDLDIRECT, TRIG, CHOLHDL No results found for: HGBA1C No results found for: VITAMINB12 No results found for: TSH  09/18/14 left elbow xray [I reviewed images myself and agree with interpretation. -VRP]  - negative  09/18/14 cervical spine xray [I reviewed images myself and agree with interpretation. -VRP]  - Congenital fusion at C2-3. Disc space narrowing at C3-4. No fracture or spondylolisthesis.    ASSESSMENT AND PLAN  39 y.o. year old female here with history of left upper extremity fracture at the  humerus and wrist at age 25 years old, now with new onset of numbness, tingling, pain in her left arm since September 2016. Patient has widespread weakness throughout the left upper extremity, with decreased pinprick sensation in the C8-T1 dermatomes.  Ddx: brachial plexopathy vs cervical radiculopathy vs tardy ulnar palsy   PLAN: - I will check EMG/NCS (electrical nerve testing) and lab testing. - May consider MRI cervical spine after above testing. - Continue gabapentin and ibuprofen. - Avoid external compression of the left elbow or wrist.  Orders Placed This Encounter  Procedures  . CBC with Differential/Platelet  . CMP  . Hemoglobin A1c  . TSH  . Vitamin B12  . NCV with EMG(electromyography)   Return for for NCV/EMG.    Penni Bombard, MD 04/07/1914, 6:06 AM Certified in Neurology, Neurophysiology and Neuroimaging  [facility] [address] [place], [place] [address] [phone]

## 2014-10-03 LAB — COMPREHENSIVE METABOLIC PANEL
ALBUMIN: 4.4 g/dL (ref 3.5–5.5)
ALK PHOS: 91 IU/L (ref 39–117)
ALT: 11 IU/L (ref 0–32)
AST: 12 IU/L (ref 0–40)
Albumin/Globulin Ratio: 2.1 (ref 1.1–2.5)
BILIRUBIN TOTAL: 0.4 mg/dL (ref 0.0–1.2)
BUN / CREAT RATIO: 14 (ref 9–23)
BUN: 12 mg/dL (ref 6–24)
CHLORIDE: 101 mmol/L (ref 97–108)
CO2: 21 mmol/L (ref 18–29)
Calcium: 9.4 mg/dL (ref 8.7–10.2)
Creatinine, Ser: 0.84 mg/dL (ref 0.57–1.00)
GFR calc Af Amer: 101 mL/min/{1.73_m2} (ref >59)
GFR calc non Af Amer: 87 mL/min/{1.73_m2} (ref >59)
GLUCOSE: 70 mg/dL (ref 65–99)
Globulin, Total: 2.1 g/dL (ref 1.5–4.5)
Potassium: 4 mmol/L (ref 3.5–5.2)
Sodium: 139 mmol/L (ref 134–144)
Total Protein: 6.5 g/dL (ref 6.0–8.5)

## 2014-10-03 LAB — CBC WITH DIFFERENTIAL/PLATELET
Basophils Absolute: 0 10*3/uL (ref 0.0–0.2)
Basos: 0 %
EOS (ABSOLUTE): 0.9 10*3/uL — ABNORMAL HIGH (ref 0.0–0.4)
Eos: 8 %
HEMOGLOBIN: 13.3 g/dL (ref 11.1–15.9)
Hematocrit: 38.7 % (ref 34.0–46.6)
Immature Grans (Abs): 0 10*3/uL (ref 0.0–0.1)
Immature Granulocytes: 0 %
Lymphocytes Absolute: 2.8 10*3/uL (ref 0.7–3.1)
Lymphs: 25 %
MCH: 30.6 pg (ref 26.6–33.0)
MCHC: 34.4 g/dL (ref 31.5–35.7)
MCV: 89 fL (ref 79–97)
MONOS ABS: 0.6 10*3/uL (ref 0.1–0.9)
Monocytes: 5 %
NEUTROS ABS: 7 10*3/uL (ref 1.4–7.0)
Neutrophils: 62 %
Platelets: 307 10*3/uL (ref 150–379)
RBC: 4.35 x10E6/uL (ref 3.77–5.28)
RDW: 14.2 % (ref 12.3–15.4)
WBC: 11.4 10*3/uL — ABNORMAL HIGH (ref 3.4–10.8)

## 2014-10-03 LAB — HEMOGLOBIN A1C
Est. average glucose Bld gHb Est-mCnc: 97 mg/dL
HEMOGLOBIN A1C: 5 % (ref 4.8–5.6)

## 2014-10-03 LAB — TSH: TSH: 1 u[IU]/mL (ref 0.450–4.500)

## 2014-10-03 LAB — VITAMIN B12: VITAMIN B 12: 269 pg/mL (ref 211–946)

## 2014-10-05 ENCOUNTER — Telehealth: Payer: Self-pay | Admitting: *Deleted

## 2014-10-05 NOTE — Telephone Encounter (Signed)
-----   Message from Penni Bombard, MD sent at 10/05/2014  7:56 AM EDT ----- Labs ok except for borderline low B12. Start multivitamin and follow up with PCP for repeat B12. -VRP

## 2014-10-05 NOTE — Telephone Encounter (Signed)
Spoke with patient and informed her per Dr Leta Baptist, all labs within normal limits except Vit B12 is borderline low. Advised she begin multivitamin and FU with PCP re: repeat Vit B 12 level. She verbalized understanding, agreement.

## 2014-10-22 ENCOUNTER — Encounter: Payer: Commercial Managed Care - HMO | Admitting: Diagnostic Neuroimaging

## 2014-10-23 ENCOUNTER — Encounter: Payer: Self-pay | Admitting: Diagnostic Neuroimaging

## 2014-10-27 ENCOUNTER — Emergency Department (HOSPITAL_COMMUNITY): Payer: Commercial Managed Care - HMO

## 2014-10-27 ENCOUNTER — Emergency Department (HOSPITAL_COMMUNITY)
Admission: EM | Admit: 2014-10-27 | Discharge: 2014-10-27 | Disposition: A | Discharge status: Home / Self Care | Payer: Commercial Managed Care - HMO | Attending: Emergency Medicine | Admitting: Emergency Medicine

## 2014-10-27 ENCOUNTER — Encounter (HOSPITAL_COMMUNITY): Payer: Self-pay | Admitting: Emergency Medicine

## 2014-10-27 DIAGNOSIS — Y9389 Activity, other specified: Secondary | ICD-10-CM | POA: Diagnosis not present

## 2014-10-27 DIAGNOSIS — S39012A Strain of muscle, fascia and tendon of lower back, initial encounter: Secondary | ICD-10-CM

## 2014-10-27 DIAGNOSIS — S3992XA Unspecified injury of lower back, initial encounter: Secondary | ICD-10-CM | POA: Diagnosis present

## 2014-10-27 DIAGNOSIS — Y9289 Other specified places as the place of occurrence of the external cause: Secondary | ICD-10-CM | POA: Insufficient documentation

## 2014-10-27 DIAGNOSIS — Z87442 Personal history of urinary calculi: Secondary | ICD-10-CM | POA: Insufficient documentation

## 2014-10-27 DIAGNOSIS — Y998 Other external cause status: Secondary | ICD-10-CM | POA: Insufficient documentation

## 2014-10-27 DIAGNOSIS — W1839XA Other fall on same level, initial encounter: Secondary | ICD-10-CM | POA: Insufficient documentation

## 2014-10-27 DIAGNOSIS — M5431 Sciatica, right side: Secondary | ICD-10-CM | POA: Diagnosis not present

## 2014-10-27 MED ORDER — PREDNISONE 50 MG PO TABS
60.0000 mg | ORAL_TABLET | Freq: Once | ORAL | Status: AC
  Administered 2014-10-27: 60 mg via ORAL
  Filled 2014-10-27 (×2): qty 1

## 2014-10-27 MED ORDER — KETOROLAC TROMETHAMINE 10 MG PO TABS
10.0000 mg | ORAL_TABLET | Freq: Once | ORAL | Status: AC
  Administered 2014-10-27: 10 mg via ORAL
  Filled 2014-10-27: qty 1

## 2014-10-27 MED ORDER — PREDNISONE 10 MG PO TABS: ORAL_TABLET | ORAL | Status: DC

## 2014-10-27 MED ORDER — METHOCARBAMOL 500 MG PO TABS: 500.0000 mg | ORAL_TABLET | Freq: Three times a day (TID) | ORAL | Status: DC

## 2014-10-27 MED ORDER — DIAZEPAM 5 MG PO TABS
10.0000 mg | ORAL_TABLET | Freq: Once | ORAL | Status: AC
  Administered 2014-10-27: 10 mg via ORAL
  Filled 2014-10-27: qty 2

## 2014-10-27 MED ORDER — DICLOFENAC SODIUM 75 MG PO TBEC: 75.0000 mg | DELAYED_RELEASE_TABLET | Freq: Two times a day (BID) | ORAL | Status: DC

## 2014-10-27 NOTE — Discharge Instructions (Signed)
Your xray is negative for acute problem. Your exam reveal muscle spasm and sciatica on the right. Please see your orthopedic MD for additional evaluation and possible MRI. See Dr Carlota Raspberry for assistance with management. Lumbosacral Strain Lumbosacral strain is a strain of any of the parts that make up your lumbosacral vertebrae. Your lumbosacral vertebrae are the bones that make up the lower third of your backbone. Your lumbosacral vertebrae are held together by muscles and tough, fibrous tissue (ligaments).  CAUSES  A sudden blow to your back can cause lumbosacral strain. Also, anything that causes an excessive stretch of the muscles in the low back can cause this strain. This is typically seen when people exert themselves strenuously, fall, lift heavy objects, bend, or crouch repeatedly. RISK FACTORS  Physically demanding work.  Participation in pushing or pulling sports or sports that require a sudden twist of the back (tennis, golf, baseball).  Weight lifting.  Excessive lower back curvature.  Forward-tilted pelvis.  Weak back or abdominal muscles or both.  Tight hamstrings. SIGNS AND SYMPTOMS  Lumbosacral strain may cause pain in the area of your injury or pain that moves (radiates) down your leg.  DIAGNOSIS Your health care provider can often diagnose lumbosacral strain through a physical exam. In some cases, you may need tests such as X-ray exams.  TREATMENT  Treatment for your lower back injury depends on many factors that your clinician will have to evaluate. However, most treatment will include the use of anti-inflammatory medicines. HOME CARE INSTRUCTIONS   Avoid hard physical activities (tennis, racquetball, waterskiing) if you are not in proper physical condition for it. This may aggravate or create problems.  If you have a back problem, avoid sports requiring sudden body movements. Swimming and walking are generally safer activities.  Maintain good posture.  Maintain a  healthy weight.  For acute conditions, you may put ice on the injured area.  Put ice in a plastic bag.  Place a towel between your skin and the bag.  Leave the ice on for 20 minutes, 2-3 times a day.  When the low back starts healing, stretching and strengthening exercises may be recommended. SEEK MEDICAL CARE IF:  Your back pain is getting worse.  You experience severe back pain not relieved with medicines. SEEK IMMEDIATE MEDICAL CARE IF:   You have numbness, tingling, weakness, or problems with the use of your arms or legs.  There is a change in bowel or bladder control.  You have increasing pain in any area of the body, including your belly (abdomen).  You notice shortness of breath, dizziness, or feel faint.  You feel sick to your stomach (nauseous), are throwing up (vomiting), or become sweaty.  You notice discoloration of your toes or legs, or your feet get very cold. MAKE SURE YOU:   Understand these instructions.  Will watch your condition.  Will get help right away if you are not doing well or get worse.   This information is not intended to replace advice given to you by your health care provider. Make sure you discuss any questions you have with your health care provider.   Document Released: 10/05/2004 Document Revised: 01/16/2014 Document Reviewed: 08/14/2012 Elsevier Interactive Patient Education 2016 [employer]  Sciatica Sciatica is pain, weakness, numbness, or tingling along your sciatic nerve. The nerve starts in the lower back and runs down the back of each leg. Nerve damage or certain conditions pinch or put pressure on the sciatic nerve. This causes the pain, weakness,  and other discomforts of sciatica. HOME CARE   Only take medicine as told by your doctor.  Apply ice to the affected area for 20 minutes. Do this 3-4 times a day for the first 48-72 hours. Then try heat in the same way.  Exercise, stretch, or do your usual activities if these do  not make your pain worse.  Go to physical therapy as told by your doctor.  Keep all doctor visits as told.  Do not wear high heels or shoes that are not supportive.  Get a firm mattress if your mattress is too soft to lessen pain and discomfort. GET HELP RIGHT AWAY IF:   You cannot control when you poop (bowel movement) or pee (urinate).  You have more weakness in your lower back, lower belly (pelvis), butt (buttocks), or legs.  You have redness or puffiness (swelling) of your back.  You have a burning feeling when you pee.  You have pain that gets worse when you lie down.  You have pain that wakes you from your sleep.  Your pain is worse than past pain.  Your pain lasts longer than 4 weeks.  You are suddenly losing weight without reason. MAKE SURE YOU:   Understand these instructions.  Will watch this condition.  Will get help right away if you are not doing well or get worse.   This information is not intended to replace advice given to you by your health care provider. Make sure you discuss any questions you have with your health care provider.   Document Released: 10/05/2007 Document Revised: 09/16/2014 Document Reviewed: 05/07/2011 Elsevier Interactive Patient Education [employer].

## 2014-10-27 NOTE — ED Provider Notes (Signed)
CSN: [id number]     Arrival date & time 10/27/14  0759 History   First MD Initiated Contact with Patient 10/27/14 0802     Chief Complaint  Patient presents with  . Fall     (Consider location/radiation/quality/duration/timing/severity/associated sxs/prior Treatment) Patient is a 39 y.o. female presenting with fall. The history is provided by the patient.  Fall This is a new problem. The current episode started in the past 7 days. The problem occurs intermittently. The problem has been gradually worsening. Pertinent negatives include no diaphoresis, numbness or weakness. The symptoms are aggravated by standing, twisting and walking. She has tried ice and NSAIDs for the symptoms. The treatment provided mild relief.    Past Medical History  Diagnosis Date  . Kidney stone   . Numbness and tingling in left arm    Past Surgical History  Procedure Laterality Date  . Cholecystectomy    . Tubal ligation    . Spine surgery    . Knee arthroscopy    . Vaginal hysterectomy N/A 06/25/2012    Procedure: HYSTERECTOMY VAGINAL;  Surgeon: Jonnie Kind, MD;  Location: [facility];  Service: Gynecology;  Laterality: N/A;  . Rectocele repair N/A 06/25/2012    Procedure: POSTERIOR REPAIR (RECTOCELE);  Surgeon: Jonnie Kind, MD;  Location: [facility];  Service: Gynecology;  Laterality: N/A;  . Hemorrhoid surgery N/A 06/25/2012    Procedure: HEMORRHOIDECTOMY;  Surgeon: Donato Heinz, MD;  Location: [facility];  Service: General;  Laterality: N/A;  . Abdominal hysterectomy    . Cystoscopy with retrograde pyelogram, ureteroscopy and stent placement Left 10/22/2013    Procedure: CYSTOSCOPY WITH RETROGRADE PYELOGRAM, STONE BASKETRY;  Surgeon: Jorja Loa, MD;  Location: [facility];  Service: Urology;  Laterality: Left;   Family History  Problem Relation Age of Onset  . Heart disease Mother   . Hypertension Mother   . Cancer Mother   . Diabetes Mother   . Stroke Mother   . Heart  disease Father   . Hypertension Father   . Heart disease Maternal Grandfather   . Heart disease Paternal Grandfather    Social History  Substance Use Topics  . Smoking status: Never Smoker   . Smokeless tobacco: Never Used  . Alcohol Use: 0.6 oz/week    1 Glasses of wine per week     Comment: "occasionally"   OB History    Gravida Para Term Preterm AB TAB SAB Ectopic Multiple Living   2 2   0          Review of Systems  Constitutional: Negative for diaphoresis.  Musculoskeletal: Positive for back pain.  Neurological: Negative for weakness and numbness.  All other systems reviewed and are negative.     Allergies  Review of patient's allergies indicates no known allergies.  Home Medications   Prior to Admission medications   Medication Sig Start Date End Date Taking? Authorizing Provider  gabapentin (NEURONTIN) 300 MG capsule Take 1 capsule nightly 09/18/14   Darlyne Russian, MD  IBUPROFEN PO Take by mouth.    Historical Provider, MD   BP 136/87 mmHg  Pulse 91  Temp(Src) 98.2 F (36.8 C)  Resp 18  Ht 5\' 3"  (1.6 m)  Wt 175 lb (79.379 kg)  BMI 31.01 kg/m2  SpO2 98%  LMP 06/08/2012 Physical Exam  Constitutional: She is oriented to person, place, and time. She appears well-developed and well-nourished.  Non-toxic appearance.  HENT:  Head: Normocephalic.  Right Ear: Tympanic  membrane and external ear normal.  Left Ear: Tympanic membrane and external ear normal.  Eyes: EOM and lids are normal. Pupils are equal, round, and reactive to light.  Neck: Normal range of motion. Neck supple. Carotid bruit is not present.  Cardiovascular: Normal rate, regular rhythm, normal heart sounds, intact distal pulses and normal pulses.   Pulmonary/Chest: Breath sounds normal. No respiratory distress.  Abdominal: Soft. Bowel sounds are normal. There is no tenderness. There is no guarding.  Musculoskeletal: Normal range of motion.       Lumbar back: She exhibits tenderness, pain and spasm.        Back:  Pain with right straight leg raise at 30.  Lymphadenopathy:       Head (right side): No submandibular adenopathy present.       Head (left side): No submandibular adenopathy present.    She has no cervical adenopathy.  Neurological: She is alert and oriented to person, place, and time. She has normal strength. No cranial nerve deficit or sensory deficit. Coordination normal.  No gross motor or sensory deficits of the lower extremities.  Skin: Skin is warm and dry.  Psychiatric: She has a normal mood and affect. Her speech is normal.  Nursing note and vitals reviewed.   ED Course  Procedures (including critical care time) Labs Review Labs Reviewed - No data to display  Imaging Review No results found. I have personally reviewed and evaluated these images and lab results as part of my medical decision-making.   EKG Interpretation None      MDM  X-ray of the lumbar spine reveals mild levoscoliosis . No significant fracture or dislocation appreciated. The disc spaces appear to be intact. No gross neurologic deficits appreciated. The exam favors lumbar strain, with sciatica.   Patient is referred to orthopedics for evaluation of the lower back pain and sciatica. Prescription for Robaxin, Voltaren, and prednisone taper given to the patient. Discharge plan discussed with the patient in terms which he understands. Feels it is safe for the patient be discharged home.    Final diagnoses:  Lumbar strain, initial encounter  Sciatica, right    *I have reviewed nursing notes, vital signs, and all appropriate lab and imaging results for this patient.Lily Kocher, PA-C 10/28/14 Ray, MD 10/28/14 [phone]

## 2014-10-27 NOTE — ED Notes (Signed)
Pt c/o right lower back pain radiating into buttocks since falling on Saturday.

## 2015-01-05 ENCOUNTER — Encounter (HOSPITAL_COMMUNITY): Payer: Self-pay | Admitting: Vascular Surgery

## 2015-01-05 ENCOUNTER — Emergency Department (HOSPITAL_COMMUNITY)
Admission: EM | Admit: 2015-01-05 | Discharge: 2015-01-05 | Disposition: A | Discharge status: Home / Self Care | Payer: Commercial Managed Care - HMO | Attending: Emergency Medicine | Admitting: Emergency Medicine

## 2015-01-05 ENCOUNTER — Emergency Department (HOSPITAL_COMMUNITY): Payer: Commercial Managed Care - HMO

## 2015-01-05 DIAGNOSIS — R350 Frequency of micturition: Secondary | ICD-10-CM | POA: Diagnosis not present

## 2015-01-05 DIAGNOSIS — R109 Unspecified abdominal pain: Secondary | ICD-10-CM | POA: Diagnosis present

## 2015-01-05 DIAGNOSIS — R300 Dysuria: Secondary | ICD-10-CM | POA: Insufficient documentation

## 2015-01-05 DIAGNOSIS — Z79899 Other long term (current) drug therapy: Secondary | ICD-10-CM | POA: Diagnosis not present

## 2015-01-05 DIAGNOSIS — Z7952 Long term (current) use of systemic steroids: Secondary | ICD-10-CM | POA: Insufficient documentation

## 2015-01-05 DIAGNOSIS — R3915 Urgency of urination: Secondary | ICD-10-CM | POA: Insufficient documentation

## 2015-01-05 DIAGNOSIS — K5900 Constipation, unspecified: Secondary | ICD-10-CM

## 2015-01-05 DIAGNOSIS — Z87442 Personal history of urinary calculi: Secondary | ICD-10-CM | POA: Insufficient documentation

## 2015-01-05 DIAGNOSIS — Z791 Long term (current) use of non-steroidal anti-inflammatories (NSAID): Secondary | ICD-10-CM | POA: Insufficient documentation

## 2015-01-05 LAB — URINALYSIS, ROUTINE W REFLEX MICROSCOPIC
Bilirubin Urine: NEGATIVE
Glucose, UA: NEGATIVE mg/dL
HGB URINE DIPSTICK: NEGATIVE
Ketones, ur: NEGATIVE mg/dL
Leukocytes, UA: NEGATIVE
NITRITE: NEGATIVE
PROTEIN: NEGATIVE mg/dL
Specific Gravity, Urine: 1.01 (ref 1.005–1.030)
pH: 6.5 (ref 5.0–8.0)

## 2015-01-05 LAB — COMPREHENSIVE METABOLIC PANEL
ALK PHOS: 83 U/L (ref 38–126)
ALT: 17 U/L (ref 14–54)
AST: 23 U/L (ref 15–41)
Albumin: 4.5 g/dL (ref 3.5–5.0)
Anion gap: 12 (ref 5–15)
BILIRUBIN TOTAL: 0.7 mg/dL (ref 0.3–1.2)
BUN: 6 mg/dL (ref 6–20)
CALCIUM: 9.8 mg/dL (ref 8.9–10.3)
CO2: 20 mmol/L — AB (ref 22–32)
CREATININE: 0.83 mg/dL (ref 0.44–1.00)
Chloride: 106 mmol/L (ref 101–111)
Glucose, Bld: 92 mg/dL (ref 65–99)
Potassium: 4 mmol/L (ref 3.5–5.1)
Sodium: 138 mmol/L (ref 135–145)
TOTAL PROTEIN: 7.1 g/dL (ref 6.5–8.1)

## 2015-01-05 LAB — CBC
HCT: 42.6 % (ref 36.0–46.0)
Hemoglobin: 14.9 g/dL (ref 12.0–15.0)
MCH: 30.5 pg (ref 26.0–34.0)
MCHC: 35 g/dL (ref 30.0–36.0)
MCV: 87.3 fL (ref 78.0–100.0)
PLATELETS: 324 10*3/uL (ref 150–400)
RBC: 4.88 MIL/uL (ref 3.87–5.11)
RDW: 12.7 % (ref 11.5–15.5)
WBC: 10.6 10*3/uL — AB (ref 4.0–10.5)

## 2015-01-05 LAB — LIPASE, BLOOD: LIPASE: 20 U/L (ref 11–51)

## 2015-01-05 MED ORDER — DICYCLOMINE HCL 10 MG PO CAPS
10.0000 mg | ORAL_CAPSULE | Freq: Once | ORAL | Status: AC
  Administered 2015-01-05: 10 mg via ORAL
  Filled 2015-01-05: qty 1

## 2015-01-05 MED ORDER — HYDROMORPHONE HCL 1 MG/ML IJ SOLN
1.0000 mg | Freq: Once | INTRAMUSCULAR | Status: AC
  Administered 2015-01-05: 1 mg via INTRAVENOUS
  Filled 2015-01-05: qty 1

## 2015-01-05 MED ORDER — ONDANSETRON HCL 4 MG/2ML IJ SOLN
4.0000 mg | Freq: Once | INTRAMUSCULAR | Status: AC
  Administered 2015-01-05: 4 mg via INTRAVENOUS
  Filled 2015-01-05: qty 2

## 2015-01-05 MED ORDER — SODIUM CHLORIDE 0.9 % IV BOLUS (SEPSIS)
1000.0000 mL | Freq: Once | INTRAVENOUS | Status: AC
  Administered 2015-01-05: 1000 mL via INTRAVENOUS

## 2015-01-05 MED ORDER — OXYCODONE-ACETAMINOPHEN 5-325 MG PO TABS
ORAL_TABLET | ORAL | Status: AC
  Filled 2015-01-05: qty 1

## 2015-01-05 MED ORDER — DICYCLOMINE HCL 20 MG PO TABS: 20.0000 mg | ORAL_TABLET | Freq: Two times a day (BID) | ORAL | Status: DC | PRN

## 2015-01-05 MED ORDER — IOHEXOL 300 MG/ML  SOLN
100.0000 mL | Freq: Once | INTRAMUSCULAR | Status: AC | PRN
  Administered 2015-01-05: 100 mL via INTRAVENOUS

## 2015-01-05 MED ORDER — OXYCODONE-ACETAMINOPHEN 5-325 MG PO TABS
1.0000 | ORAL_TABLET | Freq: Once | ORAL | Status: AC
  Administered 2015-01-05: 1 via ORAL

## 2015-01-05 MED ORDER — POLYETHYLENE GLYCOL 3350 17 G PO PACK: 17.0000 g | PACK | Freq: Two times a day (BID) | ORAL | Status: DC | PRN

## 2015-01-05 NOTE — ED Provider Notes (Signed)
CSN: [id number]     Arrival date & time 01/05/15  1053 History  By signing my name below, I, Erling Conte, attest that this documentation has been prepared under the direction and in the presence of Clayton Bibles, PA-C Electronically Signed: Erling Conte, ED Scribe. 01/05/2015. 2:45 PM.    Chief Complaint  Patient presents with  . Flank Pain   The history is provided by the patient. No language interpreter was used.    HPI Comments: Claudia Mcintyre is a 39 y.o. female with a h/o kidney stones who presents to the Emergency Department complaining of constant, gradually worsening, severe, right flank pain that radiates into her RLQ for 5 days. Pt notes she has had 2 kidney stones in the past and she was able to pass one of them but the other she had to have surgically removed. She reports associated dysuria, abdominal distension, urinary frequency, urinary urgency, and nausea. Pt also notes she has not had a normal bowel movement in 1 week and states when she did have one she had some bright red blood in her stool. Has hx hemorrhoids.  She denies any alleviating/aggravating factors. Pt states she has had a partial hysterectomy but denies any other abdominal surgeries. Pt notes this feels similar to previous kidney stones episodes but has never had this much pain in the past. She denies any h/o constipation issues. She denies an fever, chills, vomiting, chest pain or other associated symptoms.  Past Medical History  Diagnosis Date  . Kidney stone   . Numbness and tingling in left arm    Past Surgical History  Procedure Laterality Date  . Cholecystectomy    . Tubal ligation    . Spine surgery    . Knee arthroscopy    . Vaginal hysterectomy N/A 06/25/2012    Procedure: HYSTERECTOMY VAGINAL;  Surgeon: Jonnie Kind, MD;  Location: [facility];  Service: Gynecology;  Laterality: N/A;  . Rectocele repair N/A 06/25/2012    Procedure: POSTERIOR REPAIR (RECTOCELE);  Surgeon: Jonnie Kind, MD;   Location: [facility];  Service: Gynecology;  Laterality: N/A;  . Hemorrhoid surgery N/A 06/25/2012    Procedure: HEMORRHOIDECTOMY;  Surgeon: Donato Heinz, MD;  Location: [facility];  Service: General;  Laterality: N/A;  . Abdominal hysterectomy    . Cystoscopy with retrograde pyelogram, ureteroscopy and stent placement Left 10/22/2013    Procedure: CYSTOSCOPY WITH RETROGRADE PYELOGRAM, STONE BASKETRY;  Surgeon: Jorja Loa, MD;  Location: [facility];  Service: Urology;  Laterality: Left;   Family History  Problem Relation Age of Onset  . Heart disease Mother   . Hypertension Mother   . Cancer Mother   . Diabetes Mother   . Stroke Mother   . Heart disease Father   . Hypertension Father   . Heart disease Maternal Grandfather   . Heart disease Paternal Grandfather    Social History  Substance Use Topics  . Smoking status: Never Smoker   . Smokeless tobacco: Never Used  . Alcohol Use: 0.6 oz/week    1 Glasses of wine per week     Comment: "occasionally"   OB History    Gravida Para Term Preterm AB TAB SAB Ectopic Multiple Living   2 2   0          Review of Systems  Constitutional: Negative for fever and chills.  Cardiovascular: Negative for chest pain.  Gastrointestinal: Positive for nausea, abdominal pain, constipation, blood in stool and abdominal distention.  Negative for vomiting.  Genitourinary: Positive for dysuria, urgency, frequency and flank pain.  Allergic/Immunologic: Negative for immunocompromised state.  Hematological: Does not bruise/bleed easily.  Psychiatric/Behavioral: Negative for self-injury.      Allergies  Review of patient's allergies indicates no known allergies.  Home Medications   Prior to Admission medications   Medication Sig Start Date End Date Taking? Authorizing Provider  diclofenac (VOLTAREN) 75 MG EC tablet Take 1 tablet (75 mg total) by mouth 2 (two) times daily. 10/27/14   Lily Kocher, PA-C  gabapentin (NEURONTIN)  300 MG capsule Take 1 capsule nightly 09/18/14   Darlyne Russian, MD  IBUPROFEN PO Take by mouth.    Historical Provider, MD  methocarbamol (ROBAXIN) 500 MG tablet Take 1 tablet (500 mg total) by mouth 3 (three) times daily. 10/27/14   Lily Kocher, PA-C  predniSONE (DELTASONE) 10 MG tablet 5,4,3,2,1 - take with food 10/27/14   Lily Kocher, PA-C   Triage Vitals: BP 130/89 mmHg  Pulse 74  Temp(Src) 97.4 F (36.3 C)  Resp 20  Ht 5\' 3"  (1.6 m)  Wt 182 lb (82.555 kg)  BMI 32.25 kg/m2  SpO2 97%  LMP 06/08/2012  Physical Exam  Constitutional: She appears well-developed and well-nourished. No distress.  HENT:  Head: Normocephalic and atraumatic.  Neck: Neck supple.  Cardiovascular: Normal rate and regular rhythm.   Pulmonary/Chest: Effort normal and breath sounds normal. No respiratory distress. She has no wheezes. She has no rales.  Abdominal: Soft. She exhibits no distension. There is tenderness in the right lower quadrant. There is CVA tenderness (right). There is no rebound and no guarding.  Neurological: She is alert.  Skin: She is not diaphoretic.  Nursing note and vitals reviewed.   ED Course  Procedures (including critical care time)  DIAGNOSTIC STUDIES:    COORDINATION OF CARE:    Labs Review Labs Reviewed  COMPREHENSIVE METABOLIC PANEL - Abnormal; Notable for the following:    CO2 20 (*)    All other components within normal limits  CBC - Abnormal; Notable for the following:    WBC 10.6 (*)    All other components within normal limits  URINE CULTURE  URINALYSIS, ROUTINE W REFLEX MICROSCOPIC (NOT AT [facility])  LIPASE, BLOOD    Imaging Review Ct Abdomen Pelvis W Contrast  01/05/2015  CLINICAL DATA:  RIGHT flank and RIGHT lower quadrant pain since Friday, bright red blood in stools today, intermittent constipation for 1 week, nausea, history kidney stones EXAM: CT ABDOMEN AND PELVIS WITH CONTRAST TECHNIQUE: Multidetector CT imaging of the abdomen and pelvis was  performed using the standard protocol following bolus administration of intravenous contrast. Sagittal and coronal MPR images reconstructed from axial data set. CONTRAST:  170mL OMNIPAQUE IOHEXOL 300 MG/ML SOLN IV. No oral contrast administered. COMPARISON:  10/19/2013 FINDINGS: Minimal dependent bibasilar atelectasis. Gallbladder surgically absent. Liver, spleen, pancreas, kidneys, and adrenal glands normal. Unremarkable ureters and bladder. Normal appendix. Uterus surgically absent with unremarkable ovaries. Stomach and bowel loops grossly normal appearance for technique. No mass, adenopathy, free air, free fluid, inflammatory process or hernia. Bones unremarkable. IMPRESSION: No acute intra-abdominal or intrapelvic abnormalities. Electronically Signed   By: Lavonia Dana M.D.   On: 01/05/2015 15:44   I have personally reviewed and evaluated these images and lab results as part of my medical decision-making.   EKG Interpretation None      Discussed CT scan with Dr Thornton Papas regarding arrows noted on CT scan - these indicate a normal appendix.  MDM  Final diagnoses:  Abdominal pain, unspecified abdominal location  Constipation, unspecified constipation type    Afebrile, nontoxic patient with abdominal pain, constipation, dysuria x 1 week.  UA does not appear infected, culture sent.  CT scan negative with normal ovary, normal appendix, normal ureter and kidney.  S/p cholecystectomy .  Mild leukocytosis, labs otherwise unremarkable including normal Hgb.  Suspect constipation causing symptoms.  If urine culture positive, would treat.   D/C home with bentyl, miralax, return precautions.  Discussed result, findings, treatment, and follow up  with patient.  Pt given return precautions.  Pt verbalizes understanding and agrees with plan.       I doubt any other EMC precluding discharge at this time including, but not necessarily limited to the following: pyelonephritis, appendicitis, ovarian torsion.      I personally performed the services described in this documentation, which was scribed in my presence. The recorded information has been reviewed and is accurate.    Clayton Bibles, PA-C 01/05/15 1740  Merrily Pew, MD 01/07/15 1018

## 2015-01-05 NOTE — ED Notes (Signed)
Pt reports to the ED for eval of right flank pain and RLQ pain that began on Friday. Pt also reports dysuria, urinary frequency, and urgency. Pt also reports nausea but denies any active V/D. Pt also reports constipation x 1 week and she reports that when she did have a BM she had some bright red blood in her stool. Pt A&Ox4, resp e/u, and skin warm and dry.

## 2015-01-05 NOTE — Discharge Instructions (Signed)
Read the information below.  Use the prescribed medication as directed.  Please discuss all new medications with your pharmacist.  You may return to the Emergency Department at any time for worsening condition or any new symptoms that concern you.    If you develop high fevers, worsening abdominal pain, uncontrolled vomiting, or are unable to tolerate fluids by mouth, return to the ER for a recheck.   ° ° °Abdominal Pain, Adult °Many things can cause abdominal pain. Usually, abdominal pain is not caused by a disease and will improve without treatment. It can often be observed and treated at home. Your health care provider will do a physical exam and possibly order blood tests and X-rays to help determine the seriousness of your pain. However, in many cases, more time must pass before a clear cause of the pain can be found. Before that point, your health care provider may not know if you need more testing or further treatment. °HOME CARE INSTRUCTIONS °Monitor your abdominal pain for any changes. The following actions may help to alleviate any discomfort you are experiencing: °· Only take over-the-counter or prescription medicines as directed by your health care provider. °· Do not take laxatives unless directed to do so by your health care provider. °· Try a clear liquid diet (broth, tea, or water) as directed by your health care provider. Slowly move to a bland diet as tolerated. °SEEK MEDICAL CARE IF: °· You have unexplained abdominal pain. °· You have abdominal pain associated with nausea or diarrhea. °· You have pain when you urinate or have a bowel movement. °· You experience abdominal pain that wakes you in the night. °· You have abdominal pain that is worsened or improved by eating food. °· You have abdominal pain that is worsened with eating fatty foods. °· You have a fever. °SEEK IMMEDIATE MEDICAL CARE IF: °· Your pain does not go away within 2 hours. °· You keep throwing up (vomiting). °· Your pain is felt  only in portions of the abdomen, such as the right side or the left lower portion of the abdomen. °· You pass bloody or black tarry stools. °MAKE SURE YOU: °· Understand these instructions. °· Will watch your condition. °· Will get help right away if you are not doing well or get worse. °  °This information is not intended to replace advice given to you by your health care provider. Make sure you discuss any questions you have with your health care provider. °  °Document Released: 10/05/2004 Document Revised: 09/16/2014 Document Reviewed: 09/04/2012 °Elsevier Interactive Patient Education ©2016 Elsevier Inc[employer] ° °Constipation, Adult °Constipation is when a person has fewer than three bowel movements a week, has difficulty having a bowel movement, or has stools that are dry, hard, or larger than normal. As people grow older, constipation is more common. A low-fiber diet, not taking in enough fluids, and taking certain medicines may make constipation worse.  °CAUSES  °· Certain medicines, such as antidepressants, pain medicine, iron supplements, antacids, and water pills.   °· Certain diseases, such as diabetes, irritable bowel syndrome (IBS), thyroid disease, or depression.   °· Not drinking enough water.   °· Not eating enough fiber-rich foods.   °· Stress or travel.   °· Lack of physical activity or exercise.   °· Ignoring the urge to have a bowel movement.   °· Using laxatives too much.   °SIGNS AND SYMPTOMS  °· Having fewer than three bowel movements a week.   °· Straining to have a bowel movement.   °· Having   stools that are hard, dry, or larger than normal.   °· Feeling full or bloated.   °· Pain in the lower abdomen.   °· Not feeling relief after having a bowel movement.   °DIAGNOSIS  °Your health care provider will take a medical history and perform a physical exam. Further testing may be done for severe constipation. Some tests may include: °· A barium enema X-ray to examine your rectum, colon, and, sometimes,  your small intestine.   °· A sigmoidoscopy to examine your lower colon.   °· A colonoscopy to examine your entire colon. °TREATMENT  °Treatment will depend on the severity of your constipation and what is causing it. Some dietary treatments include drinking more fluids and eating more fiber-rich foods. Lifestyle treatments may include regular exercise. If these diet and lifestyle recommendations do not help, your health care provider may recommend taking over-the-counter laxative medicines to help you have bowel movements. Prescription medicines may be prescribed if over-the-counter medicines do not work.  °HOME CARE INSTRUCTIONS  °· Eat foods that have a lot of fiber, such as fruits, vegetables, whole grains, and beans. °· Limit foods high in fat and processed sugars, such as french fries, hamburgers, cookies, candies, and soda.   °· A fiber supplement may be added to your diet if you cannot get enough fiber from foods.   °· Drink enough fluids to keep your urine clear or pale yellow.   °· Exercise regularly or as directed by your health care provider.   °· Go to the restroom when you have the urge to go. Do not hold it.   °· Only take over-the-counter or prescription medicines as directed by your health care provider. Do not take other medicines for constipation without talking to your health care provider first.   °SEEK IMMEDIATE MEDICAL CARE IF:  °· You have bright red blood in your stool.   °· Your constipation lasts for more than 4 days or gets worse.   °· You have abdominal or rectal pain.   °· You have thin, pencil-like stools.   °· You have unexplained weight loss. °MAKE SURE YOU:  °· Understand these instructions. °· Will watch your condition. °· Will get help right away if you are not doing well or get worse. °  °This information is not intended to replace advice given to you by your health care provider. Make sure you discuss any questions you have with your health care provider. °  °Document Released:  09/24/2003 Document Revised: 01/16/2014 Document Reviewed: 10/07/2012 °Elsevier Interactive Patient Education ©2016 Elsevier Inc. ° °

## 2015-01-07 LAB — URINE CULTURE: Culture: 1000

## 2015-02-08 ENCOUNTER — Encounter (HOSPITAL_COMMUNITY): Payer: Self-pay | Admitting: Emergency Medicine

## 2015-02-08 ENCOUNTER — Emergency Department (HOSPITAL_COMMUNITY)
Admission: EM | Admit: 2015-02-08 | Discharge: 2015-02-08 | Disposition: A | Discharge status: Home / Self Care | Payer: Commercial Managed Care - HMO | Attending: Emergency Medicine | Admitting: Emergency Medicine

## 2015-02-08 DIAGNOSIS — Z87442 Personal history of urinary calculi: Secondary | ICD-10-CM | POA: Diagnosis not present

## 2015-02-08 DIAGNOSIS — M436 Torticollis: Secondary | ICD-10-CM | POA: Insufficient documentation

## 2015-02-08 DIAGNOSIS — M542 Cervicalgia: Secondary | ICD-10-CM | POA: Diagnosis present

## 2015-02-08 MED ORDER — DIAZEPAM 5 MG PO TABS: 5.0000 mg | ORAL_TABLET | Freq: Three times a day (TID) | ORAL | Status: DC | PRN

## 2015-02-08 MED ORDER — IBUPROFEN 400 MG PO TABS
600.0000 mg | ORAL_TABLET | Freq: Once | ORAL | Status: AC
  Administered 2015-02-08: 600 mg via ORAL
  Filled 2015-02-08: qty 1

## 2015-02-08 MED ORDER — DIAZEPAM 5 MG PO TABS
5.0000 mg | ORAL_TABLET | Freq: Once | ORAL | Status: AC
  Administered 2015-02-08: 5 mg via ORAL
  Filled 2015-02-08: qty 1

## 2015-02-08 NOTE — Discharge Instructions (Signed)
Acute Torticollis °Torticollis is a condition in which the muscles of the neck tighten (contract) abnormally, causing the neck to twist and the head to move into an unnatural position. Torticollis that develops suddenly is called acute torticollis. If torticollis becomes chronic and is left untreated, the face and neck can become deformed. °CAUSES °This condition may be caused by: °· Sleeping in an awkward position (common). °· Extending or twisting the neck muscles beyond their normal position. °· Infection. °In some cases, the cause may not be known. °SYMPTOMS °Symptoms of this condition include: °· An unnatural position of the head. °· Neck pain. °· A limited ability to move the neck. °· Twisting of the neck to one side. °DIAGNOSIS °This condition is diagnosed with a physical exam. You may also have imaging tests, such as an X-ray, CT scan, or MRI. °TREATMENT °Treatment for this condition involves trying to relax the neck muscles. It may include: °· Medicines or shots. °· Physical therapy. °· Surgery. This may be done in severe cases. °HOME CARE INSTRUCTIONS °· Take medicines only as directed by your health care provider. °· Do stretching exercises and massage your neck as directed by your health care provider. °· Keep all follow-up visits as directed by your health care provider. This is important. °SEEK MEDICAL CARE IF: °· You develop a fever. °SEEK IMMEDIATE MEDICAL CARE IF: °· You develop difficulty breathing. °· You develop noisy breathing (stridor). °· You start drooling. °· You have trouble swallowing or have pain with swallowing. °· You develop numbness or weakness in your hands or feet. °· You have changes in your speech, understanding, or vision. °· Your pain gets worse. °  °This information is not intended to replace advice given to you by your health care provider. Make sure you discuss any questions you have with your health care provider. °  °Document Released: 12/24/1999 Document Revised:  05/12/2014 Document Reviewed: 12/22/2013 °Elsevier Interactive Patient Education ©2016 Elsevier Inc. ° °

## 2015-02-08 NOTE — ED Provider Notes (Signed)
CSN: [id number]     Arrival date & time 02/08/15  0813 History   First MD Initiated Contact with Patient 02/08/15 [phone]     Chief Complaint  Patient presents with  . Neck Pain     (Consider location/radiation/quality/duration/timing/severity/associated sxs/prior Treatment) Patient is a 40 y.o. female presenting with general illness. The history is provided by the patient.  Illness Location:  Right-sided neck pain that started last night while sleeping Quality:  Aching pain worse with turning her head to the right Severity:  Mild Onset quality:  Gradual Duration: Started around 2 AM. Timing:  Constant Progression:  Unchanged Chronicity:  New Context:  Recently started on Augmentin for a bleeding mole to right neck Relieved by:  Nothing tried Worsened by:  Head tilt to the righ Ineffective treatments:  Ibuprofen at home Associated symptoms: no abdominal pain, no chest pain, no congestion, no diarrhea, no fatigue, no fever, no headaches, no loss of consciousness, no nausea, no rash, no shortness of breath and no vomiting     Past Medical History  Diagnosis Date  . Kidney stone   . Numbness and tingling in left arm    Past Surgical History  Procedure Laterality Date  . Cholecystectomy    . Tubal ligation    . Spine surgery    . Knee arthroscopy    . Vaginal hysterectomy N/A 06/25/2012    Procedure: HYSTERECTOMY VAGINAL;  Surgeon: Jonnie Kind, MD;  Location: [facility];  Service: Gynecology;  Laterality: N/A;  . Rectocele repair N/A 06/25/2012    Procedure: POSTERIOR REPAIR (RECTOCELE);  Surgeon: Jonnie Kind, MD;  Location: [facility];  Service: Gynecology;  Laterality: N/A;  . Hemorrhoid surgery N/A 06/25/2012    Procedure: HEMORRHOIDECTOMY;  Surgeon: Donato Heinz, MD;  Location: [facility];  Service: General;  Laterality: N/A;  . Abdominal hysterectomy    . Cystoscopy with retrograde pyelogram, ureteroscopy and stent placement Left 10/22/2013    Procedure: CYSTOSCOPY WITH  RETROGRADE PYELOGRAM, STONE BASKETRY;  Surgeon: Jorja Loa, MD;  Location: [facility];  Service: Urology;  Laterality: Left;   Family History  Problem Relation Age of Onset  . Heart disease Mother   . Hypertension Mother   . Cancer Mother   . Diabetes Mother   . Stroke Mother   . Heart disease Father   . Hypertension Father   . Heart disease Maternal Grandfather   . Heart disease Paternal Grandfather    Social History  Substance Use Topics  . Smoking status: Never Smoker   . Smokeless tobacco: Never Used  . Alcohol Use: 0.6 oz/week    1 Glasses of wine per week     Comment: "occasionally"   OB History    Gravida Para Term Preterm AB TAB SAB Ectopic Multiple Living   2 2   0          Review of Systems  Constitutional: Negative for fever and fatigue.  HENT: Negative for congestion, facial swelling, trouble swallowing and voice change.   Eyes: Negative for redness and visual disturbance.  Respiratory: Negative for shortness of breath.   Cardiovascular: Negative for chest pain.  Gastrointestinal: Negative for nausea, vomiting, abdominal pain and diarrhea.  Genitourinary: Negative.   Musculoskeletal: Positive for neck pain and neck stiffness.  Skin: Negative for pallor, rash and wound.  Neurological: Negative for dizziness, loss of consciousness and headaches.      Allergies  Review of patient's allergies indicates no known allergies.  Home  Medications   Prior to Admission medications   Medication Sig Start Date End Date Taking? Authorizing Provider  amoxicillin-clavulanate (AUGMENTIN) 875-125 MG tablet Take 1 tablet by mouth 2 (two) times daily. Started on 02-05-15. For 7 days   Yes Historical Provider, MD  IBUPROFEN PO Take by mouth.   Yes Historical Provider, MD  diazepam (VALIUM) 5 MG tablet Take 1 tablet (5 mg total) by mouth every 8 (eight) hours as needed for anxiety or muscle spasms. 02/08/15   Heriberto Antigua, MD   BP 122/71 mmHg  Pulse  78  Temp(Src) 98.8 F (37.1 C) (Oral)  Resp 16  Ht 5\' 3"  (1.6 m)  Wt 80.74 kg  BMI 31.54 kg/m2  SpO2 99%  LMP 06/08/2012 Physical Exam  Constitutional: She is oriented to person, place, and time. She appears well-developed and well-nourished. No distress.  HENT:  Head: Normocephalic and atraumatic.  Mouth/Throat: No oropharyngeal exudate.  Eyes: Pupils are equal, round, and reactive to light. No scleral icterus.  Neck: Trachea normal. Muscular tenderness (along right scm and right trap) present. Carotid bruit is not present. No edema and no erythema present. Decreased range of motion: only with looking right, tilting head to the right. FROM to the left, neck to chin, and looking up. No thyroid mass and no thyromegaly present.  Cardiovascular: Normal rate, regular rhythm, normal heart sounds and intact distal pulses.   Pulmonary/Chest: Effort normal and breath sounds normal.  Abdominal: Soft. She exhibits no distension. There is no tenderness. There is no rebound.  Musculoskeletal: Normal range of motion. She exhibits no edema or tenderness.  Neurological: She is alert and oriented to person, place, and time. No cranial nerve deficit. She exhibits normal muscle tone. Coordination normal.  Skin: Skin is warm and dry. She is not diaphoretic. No erythema.  Small 1cm mole with scab along right submandibular region without associated induration, fluctuance, or erythema.   Nursing note and vitals reviewed.   ED Course  Procedures (including critical care time) Labs Review Labs Reviewed - No data to display  Imaging Review No results found. I have personally reviewed and evaluated these images and lab results as part of my medical decision-making.   EKG Interpretation None      MDM   Final diagnoses:  Torticollis, acute    Patient is a 40 year old female who presents with right-sided neck pain starting last night. She is on Augmentin after being evaluated for a bleeding mole on  her right neck at urgent care. She otherwise denies any difficulty swallowing, breathing, and denies any fevers, headache, or visual changes. Further history and exam as above. Patient likely has torticollis. Do not suspect any deep-seated abscess, meningitis, or other complication at this time. Patient's symptoms completely resolved with Valium. No fever or tachycardia.  Patient stable for discharge home.  I have reviewed all results with the patient. Advised to f/u with PCP this week if symptoms continue. Will rx symptomatic management. Patient agrees to stated plan. All questions answered. Advised to call or return to have any questions, new symptoms, change in symptoms, or symptoms that they do not understand.     Heriberto Antigua, MD 02/08/15 [place], MD 02/09/15 [place], MD 02/09/15 [phone]

## 2015-02-08 NOTE — ED Notes (Signed)
Pt arrives from home reporting pain at site of mole on R anterior neck.  Pt reports pain extends around to sides of neck.  Pt reports being seen at [facility] on Friday and placed on Augmentin, given referral to dermatologist, appt Feb 13.  Pt denies difficulty swallowing.  Airway patent, resp e/u.  Pt terarful.

## 2015-05-24 ENCOUNTER — Encounter: Payer: Self-pay | Admitting: *Deleted

## 2015-09-22 ENCOUNTER — Other Ambulatory Visit: Payer: Self-pay | Admitting: Emergency Medicine

## 2016-04-11 ENCOUNTER — Ambulatory Visit (HOSPITAL_BASED_OUTPATIENT_CLINIC_OR_DEPARTMENT_OTHER)
Admission: RE | Admit: 2016-04-11 | Discharge: 2016-04-11 | Disposition: A | Discharge status: Home / Self Care | Payer: 59 | Source: Ambulatory Visit | Attending: Physician Assistant | Admitting: Physician Assistant

## 2016-04-11 ENCOUNTER — Ambulatory Visit (INDEPENDENT_AMBULATORY_CARE_PROVIDER_SITE_OTHER): Payer: 59 | Admitting: Physician Assistant

## 2016-04-11 ENCOUNTER — Encounter (HOSPITAL_BASED_OUTPATIENT_CLINIC_OR_DEPARTMENT_OTHER): Payer: Self-pay

## 2016-04-11 ENCOUNTER — Other Ambulatory Visit: Payer: Self-pay | Admitting: Physician Assistant

## 2016-04-11 VITALS — BP 119/72 | HR 72 | Temp 97.6°F | Ht 63.0 in | Wt 174.6 lb

## 2016-04-11 DIAGNOSIS — Z90710 Acquired absence of both cervix and uterus: Secondary | ICD-10-CM | POA: Diagnosis not present

## 2016-04-11 DIAGNOSIS — J9811 Atelectasis: Secondary | ICD-10-CM | POA: Insufficient documentation

## 2016-04-11 DIAGNOSIS — R1030 Lower abdominal pain, unspecified: Secondary | ICD-10-CM | POA: Insufficient documentation

## 2016-04-11 DIAGNOSIS — Z8719 Personal history of other diseases of the digestive system: Secondary | ICD-10-CM | POA: Diagnosis not present

## 2016-04-11 DIAGNOSIS — N200 Calculus of kidney: Secondary | ICD-10-CM | POA: Diagnosis not present

## 2016-04-11 LAB — POCT CBC
GRANULOCYTE PERCENT: 67 % (ref 37–80)
HCT, POC: 41.6 % (ref 37.7–47.9)
Hemoglobin: 14.5 g/dL (ref 12.2–16.2)
Lymph, poc: 2.7 (ref 0.6–3.4)
MCH, POC: 31.5 pg — AB (ref 27–31.2)
MCHC: 34.8 g/dL (ref 31.8–35.4)
MCV: 90.4 fL (ref 80–97)
MID (CBC): 0.7 (ref 0–0.9)
MPV: 6.3 fL (ref 0–99.8)
PLATELET COUNT, POC: 487 10*3/uL — AB (ref 142–424)
POC Granulocyte: 7 — AB (ref 2–6.9)
POC LYMPH %: 26 % (ref 10–50)
POC MID %: 7 %M (ref 0–12)
RBC: 4.6 M/uL (ref 4.04–5.48)
RDW, POC: 14.1 %
WBC: 10.5 10*3/uL — AB (ref 4.6–10.2)

## 2016-04-11 LAB — POC MICROSCOPIC URINALYSIS (UMFC): Mucus: ABSENT

## 2016-04-11 LAB — POCT URINALYSIS DIP (MANUAL ENTRY)
BILIRUBIN UA: NEGATIVE
Blood, UA: NEGATIVE
GLUCOSE UA: NEGATIVE
Ketones, POC UA: NEGATIVE
LEUKOCYTES UA: NEGATIVE
NITRITE UA: NEGATIVE
Protein Ur, POC: NEGATIVE
Spec Grav, UA: 1.03 (ref 1.030–1.035)
Urobilinogen, UA: 0.2 (ref ?–2.0)
pH, UA: 5.5 (ref 5.0–8.0)

## 2016-04-11 LAB — IFOBT (OCCULT BLOOD): IFOBT: NEGATIVE

## 2016-04-11 MED ORDER — DICYCLOMINE HCL 20 MG PO TABS: 20.0000 mg | ORAL_TABLET | Freq: Three times a day (TID) | ORAL | 0 refills | Status: DC | PRN

## 2016-04-11 MED ORDER — IOPAMIDOL (ISOVUE-300) INJECTION 61%
100.0000 mL | Freq: Once | INTRAVENOUS | Status: AC | PRN
  Administered 2016-04-11: 100 mL via INTRAVENOUS

## 2016-04-11 MED ORDER — ONDANSETRON 4 MG PO TBDP: 4.0000 mg | ORAL_TABLET | Freq: Three times a day (TID) | ORAL | 0 refills | Status: DC | PRN

## 2016-04-11 NOTE — Progress Notes (Signed)
Update: Pt's CT results were negative for any acute findings responsible for her LLQ pain. Called patient to inform her that I have prescribed both bentyl and zofran for spasms and nausea but if she takes this and does not have any relief or her symptoms start to worsen, she needs to seek care immediately at the ED. Also informed her that I have placed an urgent GI referral and she should hear from them in the next few days.

## 2016-04-11 NOTE — Progress Notes (Signed)
Claudia Mcintyre  MRN: [id number] DOB: [birth year]  Subjective:  Claudia Mcintyre is a 41 y.o. female seen in office today for a chief complaint of LLQ x 8 hours prior to arrival. Describes it as a pressure sensation. Went to the bathroom as soon as she had the pain but did not have any relief. Has associated abdominal distension, nausea, and decreased appetite.  Denies constipation, diarrhea, vomiting, fever, and chills. She did not eat any odd foods yesterday.  Denies smoking. Occasional alcohol use. PSH of cholecystectomy in 2001. Has partial hysterectomy in 2014. No FH of inflammatory bowel disease.   Of note, her normal daily bowel movements are loose and she typically has 4-5 a day. She does not typically strain. Has noticed intermittent bright red blood in stool over the past 6 months and notes that sometimes she will only release liquid blood from the anal canal when she thinks she needs to have a bowel movement. She has also has intermittent episodes of tenesmus, abdominal cramping, and  mucopurlent stools. Has hx of hemorrhoids. Never been evaluated by GI.   Review of Systems  Respiratory: Negative for cough and shortness of breath.   Cardiovascular: Negative for chest pain and palpitations.  Genitourinary: Negative for dysuria, frequency, hematuria and vaginal bleeding.  Musculoskeletal: Positive for arthralgias.    There are no active problems to display for this patient.   Current Outpatient Prescriptions on File Prior to Visit  Medication Sig Dispense Refill  . gabapentin (NEURONTIN) 300 MG capsule TAKE ONE CAPSULE BY MOUTH EVERY NIGHT AT BEDTIME 90 capsule 0  . IBUPROFEN PO Take by mouth.     No current facility-administered medications on file prior to visit.     No Known Allergies   Objective:  BP 119/72 (BP Location: Right Arm, Patient Position: Sitting, Cuff Size: Large)   Pulse 72   Temp 97.6 F (36.4 C) (Oral)   Ht _0  (1.6 m)   Wt 174 lb 9.6  oz (79.2 kg)   LMP 06/08/2012   SpO2 99%   BMI 30.93 kg/m   Physical Exam  Constitutional: She is oriented to person, place, and time. She appears distressed (appears uncomfortable, alternating between standing and sitting due to abdominal pain).  HENT:  Head: Normocephalic and atraumatic.  Eyes: Conjunctivae are normal.  Neck: Normal range of motion.  Cardiovascular: Normal rate, regular rhythm and normal heart sounds.   Pulmonary/Chest: Effort normal.  Abdominal: Soft. Normal appearance and bowel sounds are normal. There is tenderness (exquisite tenderness with palpation of LLQ) in the left lower quadrant. There is guarding. There is no CVA tenderness.  Genitourinary: Rectal exam shows external hemorrhoid (multiple, non thrombosed, nontender hemorrhoids noted) and tenderness. Rectal exam shows no internal hemorrhoid and no fissure.  Neurological: She is alert and oriented to person, place, and time. Gait normal.  Skin: Skin is warm and dry.  Psychiatric: Affect normal.  Vitals reviewed.    Results for orders placed or performed in visit on 04/11/16 (from the past 24 hour(s))  POCT urinalysis dipstick     Status: None   Collection Time: 04/11/16 11:49 AM  Result Value Ref Range   Color, UA yellow yellow   Clarity, UA clear clear   Glucose, UA negative negative   Bilirubin, UA negative negative   Ketones, POC UA negative negative   Spec Grav, UA 1.030 1.030 - 1.035   Blood, UA negative negative   pH, UA 5.5 5.0 - 8.0   Protein  Ur, POC negative negative   Urobilinogen, UA 0.2 Negative - 2.0   Nitrite, UA Negative Negative   Leukocytes, UA Negative Negative  POCT Microscopic Urinalysis ([facility])     Status: Abnormal   Collection Time: 04/11/16 11:59 AM  Result Value Ref Range   WBC,UR,HPF,POC None None WBC/hpf   RBC,UR,HPF,POC None None RBC/hpf   Bacteria Many (A) None, Too numerous to count   Mucus Absent Absent   Epithelial Cells, UR Per Microscopy Few (A) None, Too numerous  to count cells/hpf   Crystals PRESENT   POCT CBC     Status: Abnormal   Collection Time: 04/11/16 12:15 PM  Result Value Ref Range   WBC 10.5 (A) 4.6 - 10.2 K/uL   Lymph, poc 2.7 0.6 - 3.4   POC LYMPH PERCENT 26.0 10 - 50 %L   MID (cbc) 0.7 0 - 0.9   POC MID % 7.0 0 - 12 %M   POC Granulocyte 7.0 (A) 2 - 6.9   Granulocyte percent 67.0 37 - 80 %G   RBC 4.60 4.04 - 5.48 M/uL   Hemoglobin 14.5 12.2 - 16.2 g/dL   HCT, POC 41.6 37.7 - 47.9 %   MCV 90.4 80 - 97 fL   MCH, POC 31.5 (A) 27 - 31.2 pg   MCHC 34.8 31.8 - 35.4 g/dL   RDW, POC 14.1 %   Platelet Count, POC 487 (A) 142 - 424 K/uL   MPV 6.3 0 - 99.8 fL  IFOBT POC (occult bld, rslt in office)     Status: None   Collection Time: 04/11/16  2:48 PM  Result Value Ref Range   IFOBT Negative     Assessment and Plan :  1. Lower abdominal pain Labs pending, await CT results for treatment plan.  - POCT urinalysis dipstick - POCT Microscopic Urinalysis ([facility]) - Urine culture - POCT CBC - CMP14+EGFR - CT ABDOMEN PELVIS W CONTRAST; Future  2. History of bloody stools -Pt's chronic history of concerning GI symptoms warrants further evaluation by GI.  - Ambulatory referral to Gastroenterology - IFOBT POC (occult bld, rslt in office)  Tenna Delaine PA-C  [facility] 04/11/2016 3:22 PM

## 2016-04-11 NOTE — Patient Instructions (Addendum)
GO TO  MED CENTER  [place], [place], [place] [address]  [phone] 04/11/16  DRINK CONTRAST 1PM AND 2PM    IF you received an x-ray today, you will receive an invoice from [facility]. Please contact [facility] at [phone] with questions or concerns regarding your invoice.   IF you received labwork today, you will receive an invoice from [facility]. Please contact [facility] at [phone] with questions or concerns regarding your invoice.   Our billing staff will not be able to assist you with questions regarding bills from these companies.  You will be contacted with the lab results as soon as they are available. The fastest way to get your results is to activate your My Chart account. Instructions are located on the last page of this paperwork. If you have not heard from [place] regarding the results in 2 weeks, please contact this office.

## 2016-04-12 LAB — CMP14+EGFR
ALBUMIN: 4.5 g/dL (ref 3.5–5.5)
ALT: 13 IU/L (ref 0–32)
AST: 18 IU/L (ref 0–40)
Albumin/Globulin Ratio: 1.7 (ref 1.2–2.2)
Alkaline Phosphatase: 100 IU/L (ref 39–117)
BUN / CREAT RATIO: 11 (ref 9–23)
BUN: 10 mg/dL (ref 6–24)
Bilirubin Total: 0.6 mg/dL (ref 0.0–1.2)
CALCIUM: 9.6 mg/dL (ref 8.7–10.2)
CO2: 24 mmol/L (ref 18–29)
CREATININE: 0.87 mg/dL (ref 0.57–1.00)
Chloride: 99 mmol/L (ref 96–106)
GFR calc Af Amer: 96 mL/min/{1.73_m2} (ref >59)
GFR, EST NON AFRICAN AMERICAN: 83 mL/min/{1.73_m2} (ref >59)
GLOBULIN, TOTAL: 2.6 g/dL (ref 1.5–4.5)
GLUCOSE: 67 mg/dL (ref 65–99)
Potassium: 4.3 mmol/L (ref 3.5–5.2)
SODIUM: 139 mmol/L (ref 134–144)
TOTAL PROTEIN: 7.1 g/dL (ref 6.0–8.5)

## 2016-04-13 LAB — URINE CULTURE

## 2016-06-19 ENCOUNTER — Encounter: Payer: Self-pay | Admitting: Physician Assistant

## 2017-05-30 ENCOUNTER — Emergency Department (HOSPITAL_COMMUNITY): Payer: 59

## 2017-05-30 ENCOUNTER — Encounter (HOSPITAL_COMMUNITY): Payer: Self-pay | Admitting: Emergency Medicine

## 2017-05-30 ENCOUNTER — Emergency Department (HOSPITAL_COMMUNITY)
Admission: EM | Admit: 2017-05-30 | Discharge: 2017-05-30 | Disposition: A | Discharge status: Home / Self Care | Payer: 59 | Attending: Emergency Medicine | Admitting: Emergency Medicine

## 2017-05-30 ENCOUNTER — Other Ambulatory Visit: Payer: Self-pay

## 2017-05-30 DIAGNOSIS — N23 Unspecified renal colic: Secondary | ICD-10-CM

## 2017-05-30 DIAGNOSIS — N201 Calculus of ureter: Secondary | ICD-10-CM

## 2017-05-30 DIAGNOSIS — R109 Unspecified abdominal pain: Secondary | ICD-10-CM | POA: Diagnosis present

## 2017-05-30 DIAGNOSIS — Z79899 Other long term (current) drug therapy: Secondary | ICD-10-CM | POA: Insufficient documentation

## 2017-05-30 LAB — URINALYSIS, ROUTINE W REFLEX MICROSCOPIC
Bilirubin Urine: NEGATIVE
GLUCOSE, UA: NEGATIVE mg/dL
Ketones, ur: NEGATIVE mg/dL
LEUKOCYTES UA: NEGATIVE
NITRITE: NEGATIVE
PH: 5 (ref 5.0–8.0)
Protein, ur: 30 mg/dL — AB
RBC / HPF: >50 RBC/hpf — ABNORMAL HIGH (ref 0–5)
SPECIFIC GRAVITY, URINE: 1.018 (ref 1.005–1.030)

## 2017-05-30 LAB — CBC WITH DIFFERENTIAL/PLATELET
BASOS ABS: 0 10*3/uL (ref 0.0–0.1)
BASOS PCT: 0 %
EOS PCT: 5 %
Eosinophils Absolute: 0.5 10*3/uL (ref 0.0–0.7)
HCT: 40.7 % (ref 36.0–46.0)
Hemoglobin: 14 g/dL (ref 12.0–15.0)
LYMPHS PCT: 20 %
Lymphs Abs: 2.2 10*3/uL (ref 0.7–4.0)
MCH: 31.2 pg (ref 26.0–34.0)
MCHC: 34.4 g/dL (ref 30.0–36.0)
MCV: 90.6 fL (ref 78.0–100.0)
Monocytes Absolute: 0.5 10*3/uL (ref 0.1–1.0)
Monocytes Relative: 4 %
NEUTROS ABS: 7.8 10*3/uL — AB (ref 1.7–7.7)
Neutrophils Relative %: 71 %
Platelets: 440 10*3/uL — ABNORMAL HIGH (ref 150–400)
RBC: 4.49 MIL/uL (ref 3.87–5.11)
RDW: 13.4 % (ref 11.5–15.5)
WBC: 11.1 10*3/uL — AB (ref 4.0–10.5)

## 2017-05-30 LAB — COMPREHENSIVE METABOLIC PANEL
ALBUMIN: 4.5 g/dL (ref 3.5–5.0)
ALK PHOS: 103 U/L (ref 38–126)
ALT: 15 U/L (ref 14–54)
ANION GAP: 8 (ref 5–15)
AST: 17 U/L (ref 15–41)
BILIRUBIN TOTAL: 0.9 mg/dL (ref 0.3–1.2)
BUN: 10 mg/dL (ref 6–20)
CALCIUM: 9.6 mg/dL (ref 8.9–10.3)
CO2: 24 mmol/L (ref 22–32)
Chloride: 104 mmol/L (ref 101–111)
Creatinine, Ser: 1.05 mg/dL — ABNORMAL HIGH (ref 0.44–1.00)
GFR calc Af Amer: >60 mL/min (ref >60)
GLUCOSE: 121 mg/dL — AB (ref 65–99)
Potassium: 3.7 mmol/L (ref 3.5–5.1)
Sodium: 136 mmol/L (ref 135–145)
TOTAL PROTEIN: 8.1 g/dL (ref 6.5–8.1)

## 2017-05-30 LAB — LIPASE, BLOOD: Lipase: 23 U/L (ref 11–51)

## 2017-05-30 MED ORDER — ONDANSETRON HCL 4 MG PO TABS: 4.0000 mg | ORAL_TABLET | Freq: Four times a day (QID) | ORAL | 0 refills | Status: DC | PRN

## 2017-05-30 MED ORDER — MORPHINE SULFATE (PF) 4 MG/ML IV SOLN
4.0000 mg | INTRAVENOUS | Status: DC | PRN
  Administered 2017-05-30 (×3): 4 mg via INTRAVENOUS
  Filled 2017-05-30 (×3): qty 1

## 2017-05-30 MED ORDER — ONDANSETRON HCL 4 MG/2ML IJ SOLN
4.0000 mg | Freq: Once | INTRAMUSCULAR | Status: AC
  Administered 2017-05-30: 4 mg via INTRAVENOUS
  Filled 2017-05-30: qty 2

## 2017-05-30 MED ORDER — SODIUM CHLORIDE 0.9 % IV BOLUS
1000.0000 mL | Freq: Once | INTRAVENOUS | Status: AC
  Administered 2017-05-30: 1000 mL via INTRAVENOUS

## 2017-05-30 MED ORDER — OXYCODONE-ACETAMINOPHEN 5-325 MG PO TABS
2.0000 | ORAL_TABLET | Freq: Once | ORAL | Status: AC
  Administered 2017-05-30: 2 via ORAL
  Filled 2017-05-30: qty 2

## 2017-05-30 MED ORDER — OXYCODONE-ACETAMINOPHEN 5-325 MG PO TABS: 1.0000 | ORAL_TABLET | ORAL | 0 refills | Status: DC | PRN

## 2017-05-30 MED ORDER — TAMSULOSIN HCL 0.4 MG PO CAPS: 0.4000 mg | ORAL_CAPSULE | Freq: Every day | ORAL | 0 refills | Status: DC

## 2017-05-30 NOTE — ED Provider Notes (Signed)
Pt feels better after meds and wants to go home now. Tx symptomatically, f/u Uro MD.    Francine Graven, DO 05/30/17 1212

## 2017-05-30 NOTE — ED Triage Notes (Signed)
Left flank pain since last night at 8 pm. Pain is sharp and has a history of kidney stones.

## 2017-05-30 NOTE — ED Provider Notes (Signed)
[facility] EMERGENCY DEPARTMENT Provider Note   CSN: [id number] Arrival date & time: 05/30/17  0533     History   Chief Complaint Chief Complaint  Patient presents with  . Flank Pain    left    HPI Claudia Mcintyre is a 42 y.o. female.  The history is provided by the patient.  She has a history of kidney stones and comes in with severe left flank pain which started at 8 PM.  Pain radiates to the left lower abdomen and across to the right lower abdomen.  Pain is severe and she rates it at 10/10.  Nothing makes it better, nothing makes it worse.  She is unable to get in a comfortable position.  There is associated nausea without vomiting.  She denies fever, chills, sweats.  She denies any urinary difficulty.  She states that pain is similar to what she has had with previous kidney stones.  She has taken ibuprofen at home without relief.  Past Medical History:  Diagnosis Date  . Kidney stone   . Numbness and tingling in left arm     There are no active problems to display for this patient.   Past Surgical History:  Procedure Laterality Date  . ABDOMINAL HYSTERECTOMY    . CHOLECYSTECTOMY    . CYSTOSCOPY WITH RETROGRADE PYELOGRAM, URETEROSCOPY AND STENT PLACEMENT Left 10/22/2013   Procedure: CYSTOSCOPY WITH RETROGRADE PYELOGRAM, STONE BASKETRY;  Surgeon: Jorja Loa, MD;  Location: [facility];  Service: Urology;  Laterality: Left;  . HEMORRHOID SURGERY N/A 06/25/2012   Procedure: HEMORRHOIDECTOMY;  Surgeon: Donato Heinz, MD;  Location: [facility];  Service: General;  Laterality: N/A;  . KNEE ARTHROSCOPY    . RECTOCELE REPAIR N/A 06/25/2012   Procedure: POSTERIOR REPAIR (RECTOCELE);  Surgeon: Jonnie Kind, MD;  Location: [facility];  Service: Gynecology;  Laterality: N/A;  . SPINE SURGERY    . TUBAL LIGATION    . VAGINAL HYSTERECTOMY N/A 06/25/2012   Procedure: HYSTERECTOMY VAGINAL;  Surgeon: Jonnie Kind, MD;  Location: [facility];  Service: Gynecology;   Laterality: N/A;     OB History    Gravida  2   Para  2   Term      Preterm      AB  0   Living        SAB      TAB      Ectopic      Multiple      Live Births               Home Medications    Prior to Admission medications   Medication Sig Start Date End Date Taking? Authorizing Provider  IBUPROFEN PO Take by mouth.   Yes [provider]  dicyclomine (BENTYL) 20 MG tablet Take 1 tablet (20 mg total) by mouth 3 (three) times daily as needed for spasms. 04/11/16   Tenna Delaine D, PA-C  gabapentin (NEURONTIN) 300 MG capsule TAKE ONE CAPSULE BY MOUTH EVERY NIGHT AT BEDTIME 09/27/15   Darlyne Russian, MD  ondansetron (ZOFRAN ODT) 4 MG disintegrating tablet Take 1 tablet (4 mg total) by mouth every 8 (eight) hours as needed for nausea or vomiting. 04/11/16   Leonie Douglas, PA-C    Family History Family History  Problem Relation Age of Onset  . Heart disease Mother   . Hypertension Mother   . Cancer Mother   . Diabetes Mother   . Stroke Mother   .  Heart disease Father   . Hypertension Father   . Heart disease Maternal Grandfather   . Heart disease Paternal Grandfather     Social History Social History   Tobacco Use  . Smoking status: Never Smoker  . Smokeless tobacco: Never Used  Substance Use Topics  . Alcohol use: Yes    Alcohol/week: 0.6 oz    Types: 1 Glasses of wine per week    Comment: "occasionally"  . Drug use: No     Allergies   Patient has no known allergies.   Review of Systems Review of Systems  All other systems reviewed and are negative.    Physical Exam Updated Vital Signs BP (!) 143/105 (BP Location: Right Arm)   Pulse 96   Temp 98 F (36.7 C) (Oral)   Resp 18   Ht 5\' 2"  (1.575 m)   Wt 77.1 kg (170 lb)   LMP 06/08/2012   SpO2 100%   BMI 31.09 kg/m   Physical Exam  Nursing note and vitals reviewed.  42 year old female, appears uncomfortable and in pain, but is in no acute distress. Vital signs  are significant for elevated blood pressure. Oxygen saturation is 100%, which is normal. Head is normocephalic and atraumatic. PERRLA, EOMI. Oropharynx is clear. Neck is nontender and supple without adenopathy or JVD. Back is nontender in the midline.  There is moderate left CVA tenderness. Lungs are clear without rales, wheezes, or rhonchi. Chest is nontender. Heart has regular rate and rhythm without murmur. Abdomen is soft, flat, with moderate tenderness in the left mid and lower abdomen.  No other abdominal tenderness is elicited.  There are no masses or hepatosplenomegaly and peristalsis is hypoactive. Extremities have no cyanosis or edema, full range of motion is present. Skin is warm and dry without rash. Neurologic: Mental status is normal, cranial nerves are intact, there are no motor or sensory deficits.  ED Treatments / Results  Labs (all labs ordered are listed, but only abnormal results are displayed) Labs Reviewed  COMPREHENSIVE METABOLIC PANEL - Abnormal; Notable for the following components:      Result Value   Glucose, Bld 121 (*)    Creatinine, Ser 1.05 (*)    All other components within normal limits  CBC WITH DIFFERENTIAL/PLATELET - Abnormal; Notable for the following components:   WBC 11.1 (*)    Platelets 440 (*)    Neutro Abs 7.8 (*)    All other components within normal limits  URINALYSIS, ROUTINE W REFLEX MICROSCOPIC - Abnormal; Notable for the following components:   APPearance HAZY (*)    Hgb urine dipstick LARGE (*)    Protein, ur 30 (*)    RBC / HPF >50 (*)    Bacteria, UA RARE (*)    All other components within normal limits  LIPASE, BLOOD    Radiology Ct Renal Stone Study  Result Date: 05/30/2017 CLINICAL DATA:  Left flank pain.  Recurrent stone disease suspected. EXAM: CT ABDOMEN AND PELVIS WITHOUT CONTRAST TECHNIQUE: Multidetector CT imaging of the abdomen and pelvis was performed following the standard protocol without IV contrast. COMPARISON:   CT 04/11/2016 FINDINGS: Lower chest: The lung bases are clear. Hepatobiliary: No focal liver abnormality is seen. Status post cholecystectomy. No biliary dilatation. Pancreas: No ductal dilatation or inflammation. Spleen: Normal in size without focal abnormality. Adrenals/Urinary Tract: Normal adrenal glands. Obstructing 4 x 4 mm stone in the left mid proximal ureter (at the level of L3-L4) with moderate proximal hydronephrosis. Mild left  perinephric edema. More distal ureter is decompressed. No additional nonobstructing stones in either kidney. No right hydronephrosis. Ureters decompressed. Urinary bladder is completely nondistended. Stomach/Bowel: Stomach is within normal limits. Appendix appears normal. No evidence of bowel wall thickening, distention, or inflammatory changes. Vascular/Lymphatic: No enlarged abdominal or pelvic lymph nodes. Normal course and caliber of abdominal vessels. 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 Reproductive: Post hysterectomy.  Both ovaries are normal. Other: No free air, free fluid, or intra-abdominal fluid collection. Musculoskeletal: Bone island in the left superior pubic ramus, unchanged. Mild scoliotic curvature of spine. There are no acute or suspicious osseous abnormalities. IMPRESSION: Obstructing 4 mm stone in the left mid proximal ureter with moderate hydronephrosis. Electronically Signed   By: Jeb Levering M.D.   On: 05/30/2017 06:36    Procedures Procedures  Medications Ordered in ED Medications  morphine 4 MG/ML injection 4 mg (4 mg Intravenous Given 05/30/17 0650)  sodium chloride 0.9 % bolus 1,000 mL (0 mLs Intravenous Stopped 05/30/17 0652)  ondansetron (ZOFRAN) injection 4 mg (4 mg Intravenous Given 05/30/17 0608)     Initial Impression / Assessment and Plan / ED Course  I have reviewed the triage vital signs and the nursing notes.  Pertinent labs  & imaging results that were available during my care of the patient were reviewed by me and considered in my medical decision making (see chart for details).  Left flank pain suspicious for renal colic.  Old records are reviewed, and CT of abdomen and pelvis in April 2018 did show a punctate left renal calculus.  She will be sent for renal stone protocol CT scan.  She will be given IV fluids and morphine, as well as ondansetron for nausea.  Nausea is improved, but pain is still significant even after 2 doses of morphine.  She will be given additional morphine.  Urinalysis shows hematuria, consistent with ureterolithiasis.  Remainder of labs are unremarkable.  CT scan does show 4 mm ureteral calculus in the left mid ureter with proximal hydronephrosis.  Once patient gets adequate pain control, she will be discharged with urology follow-up.  Prescriptions given for tamsulosin, ondansetron, oxycodone-acetaminophen.  Case is signed out to Dr. Thurnell Garbe.  Final Clinical Impressions(s) / ED Diagnoses   Final diagnoses:  Ureteral colic  Ureterolithiasis    ED Discharge Orders    None       Delora Fuel, MD 38/46/65 [phone]

## 2017-05-30 NOTE — Discharge Instructions (Addendum)
Return if you develop a fever, or if pain is not being adequately controlled at home.

## 2017-09-09 IMAGING — CT CT ABD-PELV W/ CM
2 of 5 series · 17 of 46 positions shown, 19 images · IV contrast (Omni 300)
Comparison: 10/19/2013

CLINICAL DATA: RIGHT flank and RIGHT lower quadrant pain since
[REDACTED], bright red blood in stools today, intermittent constipation
for 1 week, nausea, history kidney stones

EXAM:
CT ABDOMEN AND PELVIS WITH CONTRAST
TECHNIQUE: Multidetector CT imaging of the abdomen and pelvis was performed
using the standard protocol following bolus administration of
intravenous contrast. Sagittal and coronal MPR images reconstructed
from axial data set.
CONTRAST:  100mL OMNIPAQUE IOHEXOL 300 MG/ML SOLN IV. No oral
contrast administered.

[Series 2: a/p w/ 5mm · axial · 0.76mm/px · z∈[+426,+866]mm · 14 of 100 slices shown, 16 images]
[im 6/100  soft-tissue]
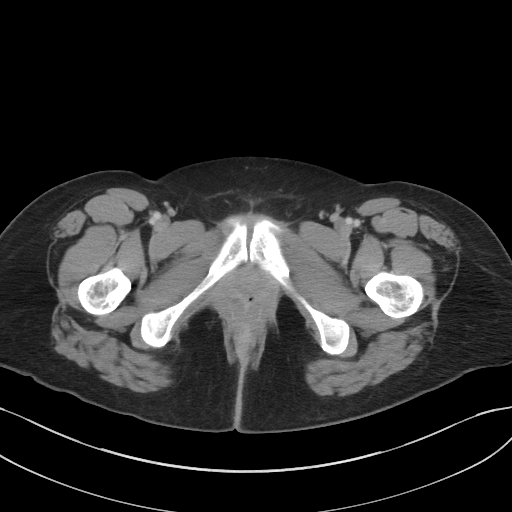
[im 6/100  bone]
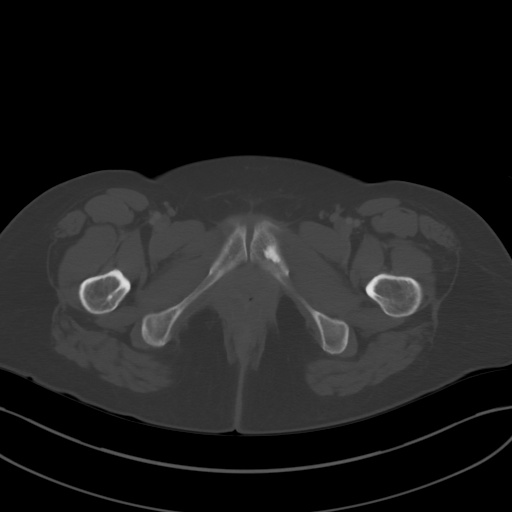
[im 11/100  soft-tissue]
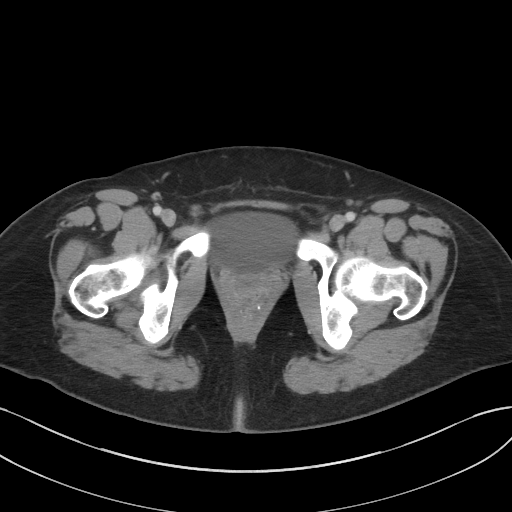
[im 21/100  soft-tissue]
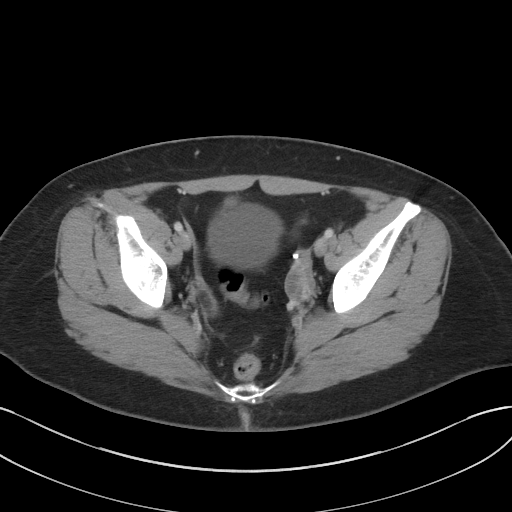
[im 27/100  soft-tissue]
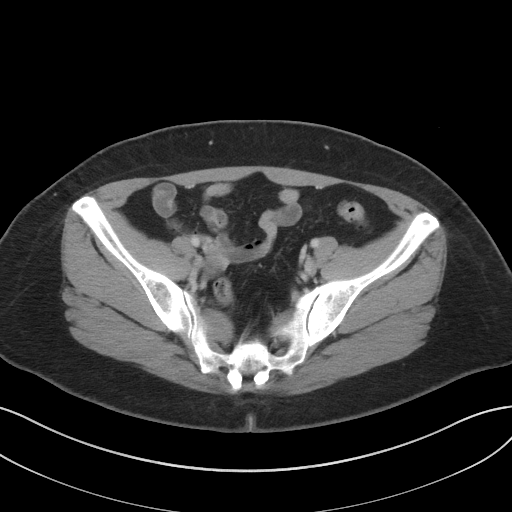
[im 32/100  soft-tissue]
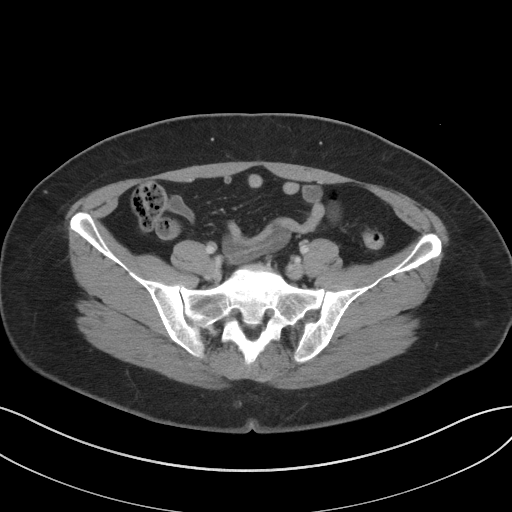
[im 42/100  soft-tissue]
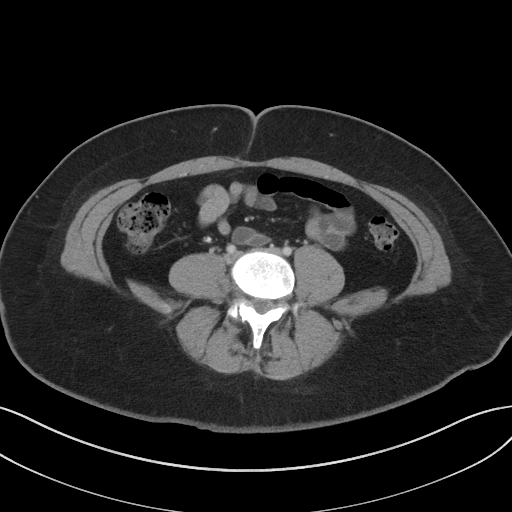
[im 47/100  soft-tissue]
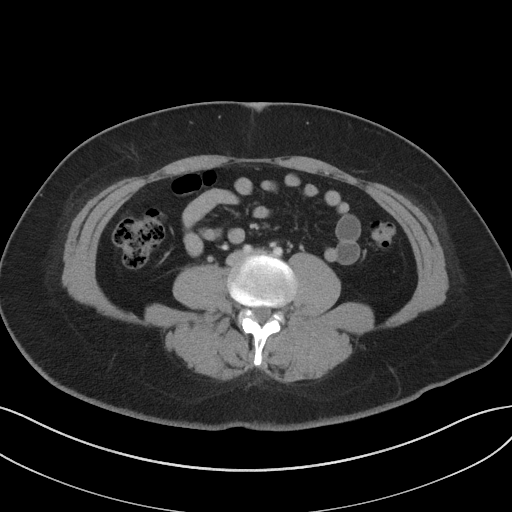
[im 53/100  soft-tissue]
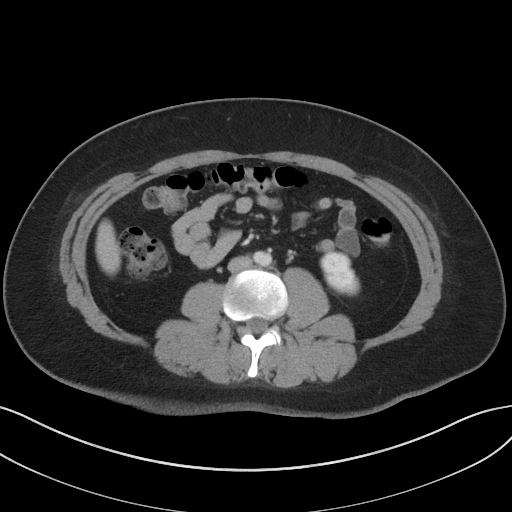
[im 58/100  soft-tissue]
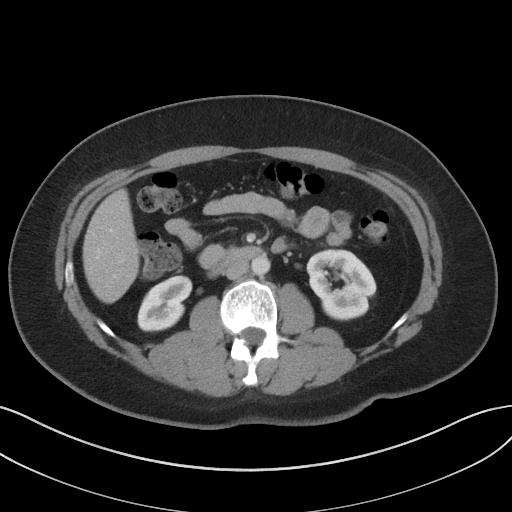
[im 58/100  bone]
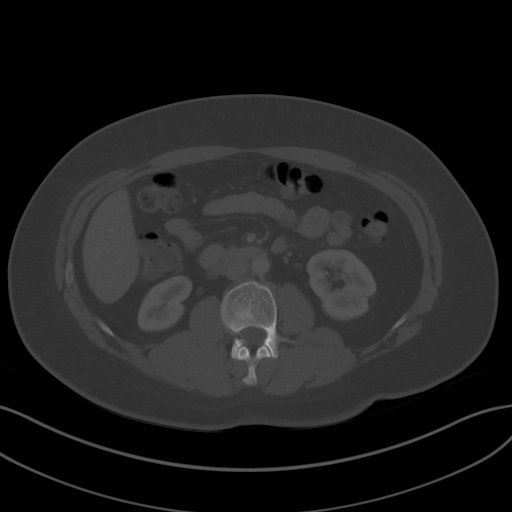
[im 68/100  soft-tissue]
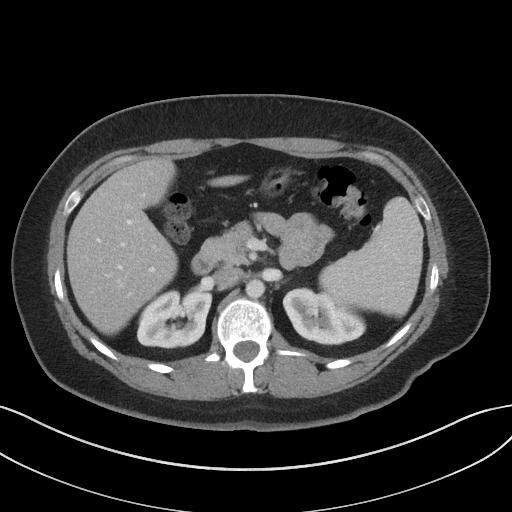
[im 73/100  soft-tissue]
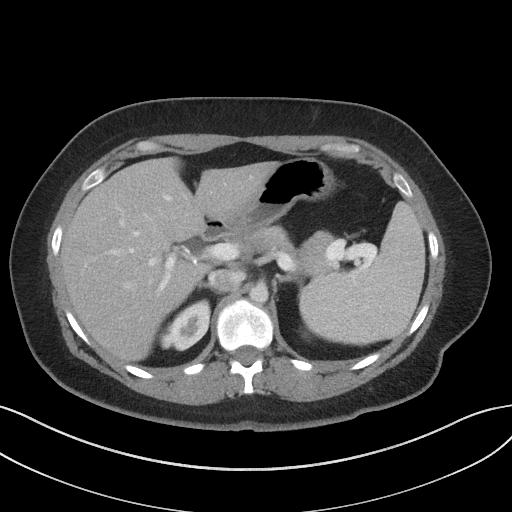
[im 79/100  soft-tissue]
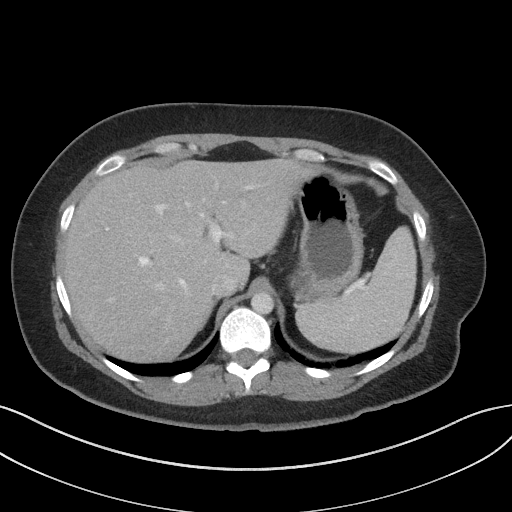
[im 89/100  soft-tissue]
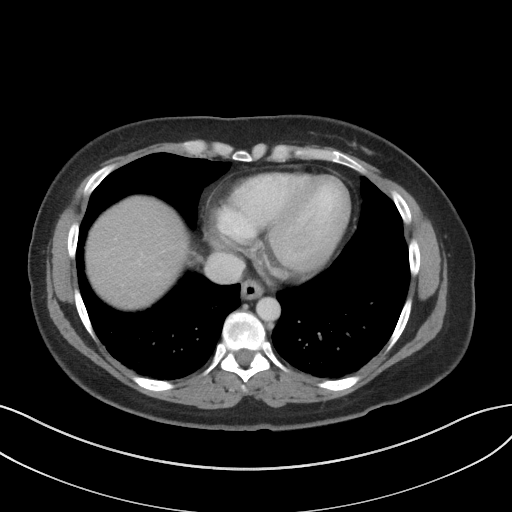
[im 94/100  soft-tissue]
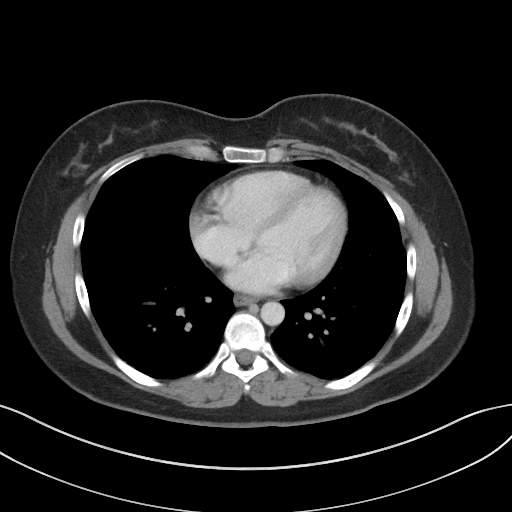

[Series 5: a/p w/ cor · coronal · 0.80mm/px · 3 of 108 slices shown]
[im 36/108  soft-tissue]
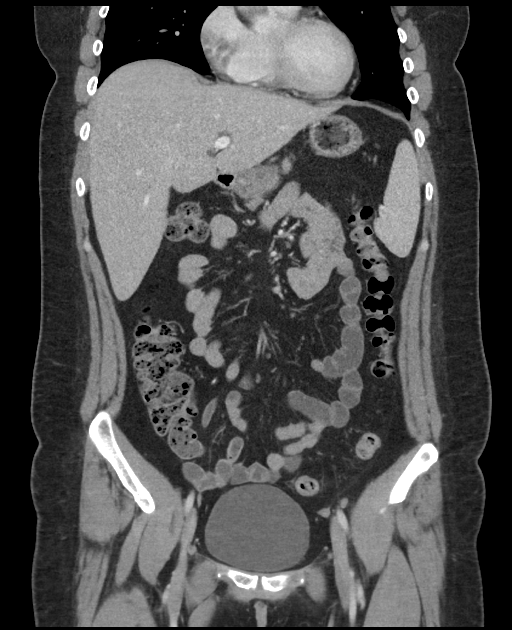
[im 48/108  soft-tissue]
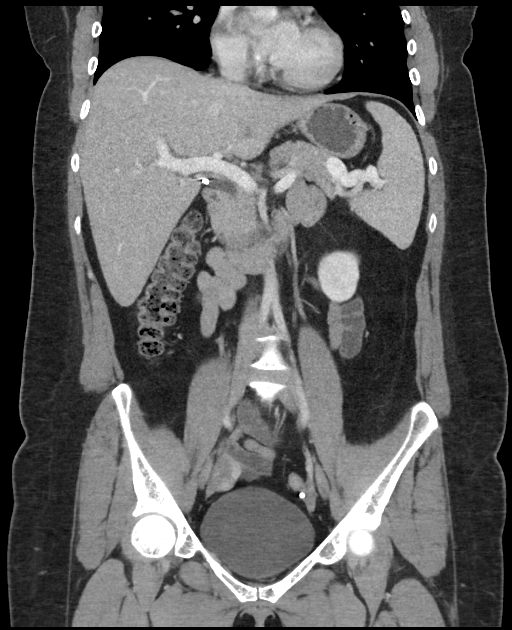
[im 60/108  soft-tissue]
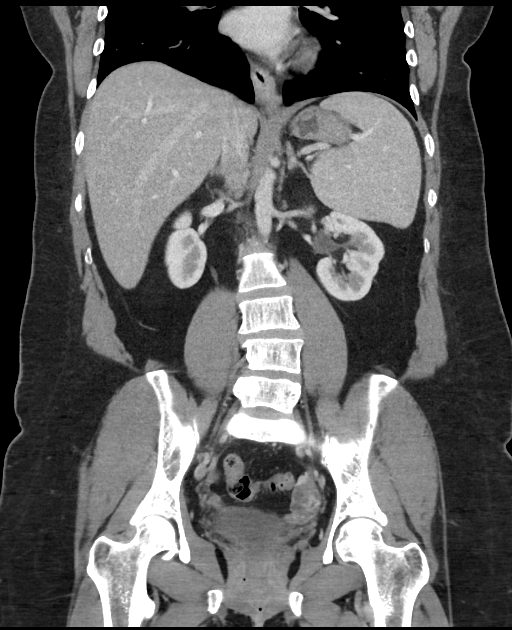

[17 of 46 positions shown; findings below may reference images not displayed]

FINDINGS: Minimal dependent bibasilar atelectasis.

Gallbladder surgically absent.

Liver, spleen, pancreas, kidneys, and adrenal glands normal.

Unremarkable ureters and bladder.

Normal appendix.

Uterus surgically absent with unremarkable ovaries.

Stomach and bowel loops grossly normal appearance for technique.

No mass, adenopathy, free air, free fluid, inflammatory process or
hernia.

Bones unremarkable.
IMPRESSION: No acute intra-abdominal or intrapelvic abnormalities.

## 2018-01-16 ENCOUNTER — Other Ambulatory Visit: Payer: Self-pay | Admitting: Family Medicine

## 2018-01-16 ENCOUNTER — Ambulatory Visit
Admission: RE | Admit: 2018-01-16 | Discharge: 2018-01-16 | Disposition: A | Discharge status: Home / Self Care | Payer: 59 | Source: Ambulatory Visit

## 2018-01-16 DIAGNOSIS — Z1231 Encounter for screening mammogram for malignant neoplasm of breast: Secondary | ICD-10-CM

## 2018-01-17 ENCOUNTER — Other Ambulatory Visit: Payer: Self-pay | Admitting: Family Medicine

## 2018-01-17 ENCOUNTER — Other Ambulatory Visit: Payer: Self-pay | Admitting: Pediatrics

## 2018-01-17 DIAGNOSIS — R928 Other abnormal and inconclusive findings on diagnostic imaging of breast: Secondary | ICD-10-CM

## 2018-01-23 ENCOUNTER — Other Ambulatory Visit: Payer: Self-pay | Admitting: Family Medicine

## 2018-01-23 ENCOUNTER — Ambulatory Visit
Admission: RE | Admit: 2018-01-23 | Discharge: 2018-01-23 | Disposition: A | Discharge status: Home / Self Care | Payer: 59 | Source: Ambulatory Visit | Attending: Family Medicine | Admitting: Family Medicine

## 2018-01-23 DIAGNOSIS — N6489 Other specified disorders of breast: Secondary | ICD-10-CM

## 2018-01-23 DIAGNOSIS — R928 Other abnormal and inconclusive findings on diagnostic imaging of breast: Secondary | ICD-10-CM

## 2018-01-30 ENCOUNTER — Ambulatory Visit
Admission: RE | Admit: 2018-01-30 | Discharge: 2018-01-30 | Disposition: A | Discharge status: Home / Self Care | Payer: 59 | Source: Ambulatory Visit | Attending: Family Medicine | Admitting: Family Medicine

## 2018-01-30 DIAGNOSIS — N6489 Other specified disorders of breast: Secondary | ICD-10-CM

## 2018-02-08 ENCOUNTER — Ambulatory Visit: Payer: Self-pay | Admitting: General Surgery

## 2018-02-08 DIAGNOSIS — N6021 Fibroadenosis of right breast: Secondary | ICD-10-CM

## 2018-02-12 ENCOUNTER — Other Ambulatory Visit: Payer: Self-pay | Admitting: General Surgery

## 2018-02-12 DIAGNOSIS — N6021 Fibroadenosis of right breast: Secondary | ICD-10-CM

## 2018-03-01 NOTE — Pre-Procedure Instructions (Addendum)
Kelbie Moro [facility]  03/01/2018    Your procedure is scheduled on Monday, March 2.  Report to  Entrance 'A'  Register in the Admitting office at 9:30 AM              Your surgery or procedure is scheduled for 11:30 A.M.   Call this number if you have problems the morning of surgery: [phone]  This is the number for the Pre- Surgical Desk.               For any other questions, please call [phone], Monday - Friday 8 AM - 4 PM.    Remember:  Do not eat after midnight Sunday, March 1.  You may drink clear liquids until 6:30 AM .  Clear liquids allowed are:  Water, Juice (non-citric and without pulp), Carbonated beverages, Clear Tea, Black Coffee only, Plain Jell-O only, Gatorade and Plain Popsicles only    Take these medicines the morning of surgery with A SIP OF WATER : None  1 Week prior to surgery STOP taking Aspirin, Aspirin Products (Goody Powder, Excedrin Migraine), Ibuprofen (Advil), Naproxen (Aleve), Vitamins and Herbal Products (ie Fish Oil).   Special instructions:   [facility]- Preparing For Surgery  Before surgery, you can play an important role. Because skin is not sterile, your skin needs to be as free of germs as possible. You can reduce the number of germs on your skin by washing with CHG (chlorahexidine gluconate) Soap before surgery.  CHG is an antiseptic cleaner which kills germs and bonds with the skin to continue killing germs even after washing.    Oral Hygiene is also important to reduce your risk of infection.  Remember - BRUSH YOUR TEETH THE MORNING OF SURGERY WITH YOUR REGULAR TOOTHPASTE  Please do not use if you have an allergy to CHG or antibacterial soaps. If your skin becomes reddened/irritated stop using the CHG.  Do not shave (including legs and underarms) for at least 48 hours prior to first CHG shower. It is OK to shave your face.  Please follow these instructions carefully.   1. Shower the NIGHT BEFORE SURGERY and the MORNING OF  SURGERY with CHG.   2. If you chose to wash your hair, wash your hair first as usual with your normal shampoo.  3. After you shampoo, wash your face and private area with the soap you use at home, then rinse your hair and body thoroughly to remove the shampoo and soap.  4. Use CHG as you would any other liquid soap. You can apply CHG directly to the skin and wash gently with a scrungie or a clean washcloth.   5. Apply the CHG Soap to your body ONLY FROM THE NECK DOWN.  Do not use on open wounds or open sores. Avoid contact with your eyes, ears, mouth and genitals (private parts).  6.  7. Wash thoroughly, paying special attention to the area where your surgery will be performed.  8. Thoroughly rinse your body with warm water from the neck down.  9. DO NOT shower/wash with your normal soap after using and rinsing off the CHG Soap.  10. Pat yourself dry with a CLEAN TOWEL.  11. Wear CLEAN PAJAMAS to bed the night before surgery, wear comfortable clothes the morning of surgery  12. Place CLEAN SHEETS on your bed the night of your first shower and DO NOT SLEEP WITH PETS.  Day of Surgery: Shower as written above.  Do not apply any  deodorants/lotions.  Please wear clean clothes to the hospital/surgery center.   Remember to brush your teeth WITH YOUR REGULAR TOOTHPASTE.  Do not wear jewelry, make-up or nail polish.  Do not wear  powders, or perfume.  Do not shave 48 hours prior to surgery.  Men may shave face and neck.  Do not bring valuables to the hospital.  [facility] is not responsible for any belongings or valuables.  Contacts, dentures or bridgework may not be worn into surgery.  Leave your suitcase in the car.  After surgery it may be brought to your room.  For patients admitted to the hospital, discharge time will be determined by your treatment team.  Patients discharged the day of surgery will not be allowed to drive home.   Please read over the following fact sheets that  you were given:  Managing Pain, Surgical Site Infection, Coughing and Deep Breathing

## 2018-03-04 ENCOUNTER — Encounter (HOSPITAL_COMMUNITY)
Admission: RE | Admit: 2018-03-04 | Discharge: 2018-03-04 | Disposition: A | Discharge status: Home / Self Care | Payer: 59 | Source: Ambulatory Visit | Attending: General Surgery | Admitting: General Surgery

## 2018-03-04 ENCOUNTER — Encounter (HOSPITAL_COMMUNITY): Payer: Self-pay

## 2018-03-04 ENCOUNTER — Other Ambulatory Visit: Payer: Self-pay

## 2018-03-04 DIAGNOSIS — Z01812 Encounter for preprocedural laboratory examination: Secondary | ICD-10-CM | POA: Insufficient documentation

## 2018-03-04 HISTORY — DX: Malignant (primary) neoplasm, unspecified: C80.1

## 2018-03-04 HISTORY — DX: Personal history of urinary calculi: Z87.442

## 2018-03-04 HISTORY — DX: Unspecified osteoarthritis, unspecified site: M19.90

## 2018-03-04 LAB — CBC
HCT: 40.7 % (ref 36.0–46.0)
Hemoglobin: 14 g/dL (ref 12.0–15.0)
MCH: 32.1 pg (ref 26.0–34.0)
MCHC: 34.4 g/dL (ref 30.0–36.0)
MCV: 93.3 fL (ref 80.0–100.0)
Platelets: 309 10*3/uL (ref 150–400)
RBC: 4.36 MIL/uL (ref 3.87–5.11)
RDW: 13.8 % (ref 11.5–15.5)
WBC: 10.7 10*3/uL — ABNORMAL HIGH (ref 4.0–10.5)
nRBC: 0 % (ref 0.0–0.2)

## 2018-03-04 NOTE — Progress Notes (Signed)
PCP - Corliss Parish MD Cardiologist - none  Chest x-ray - n/a EKG - n/a Stress Test - denies ECHO - denies Cardiac Cath -denies  Sleep Study - n/a CPAP -   Fasting Blood Sugar -n/a  Checks Blood Sugar _____ times a day  Blood Thinner Instructions n  /a: Aspirin Instructions:  Anesthesia review: no  Patient denies shortness of breath, fever, cough and chest pain at PAT appointment   Patient verbalized understanding of instructions that were given to them at the PAT appointment. Patient was also instructed that they will need to review over the PAT instructions again at home before surgery.

## 2018-03-08 ENCOUNTER — Ambulatory Visit
Admission: RE | Admit: 2018-03-08 | Discharge: 2018-03-08 | Disposition: A | Discharge status: Home / Self Care | Payer: 59 | Source: Ambulatory Visit | Attending: General Surgery | Admitting: General Surgery

## 2018-03-08 DIAGNOSIS — N6021 Fibroadenosis of right breast: Secondary | ICD-10-CM

## 2018-03-11 ENCOUNTER — Other Ambulatory Visit: Payer: Self-pay

## 2018-03-11 ENCOUNTER — Ambulatory Visit (HOSPITAL_COMMUNITY): Payer: 59 | Admitting: Critical Care Medicine

## 2018-03-11 ENCOUNTER — Encounter (HOSPITAL_COMMUNITY): Payer: Self-pay

## 2018-03-11 ENCOUNTER — Encounter (HOSPITAL_COMMUNITY)
Admission: RE | Disposition: A | Discharge status: Home / Self Care | Payer: Self-pay | Source: Home / Self Care | Attending: General Surgery

## 2018-03-11 ENCOUNTER — Ambulatory Visit
Admission: RE | Admit: 2018-03-11 | Discharge: 2018-03-11 | Disposition: A | Discharge status: Home / Self Care | Payer: 59 | Source: Ambulatory Visit | Attending: General Surgery | Admitting: General Surgery

## 2018-03-11 ENCOUNTER — Ambulatory Visit (HOSPITAL_COMMUNITY)
Admission: RE | Admit: 2018-03-11 | Discharge: 2018-03-11 | Disposition: A | Discharge status: Home / Self Care | Payer: 59 | Attending: General Surgery | Admitting: General Surgery

## 2018-03-11 DIAGNOSIS — Z85820 Personal history of malignant melanoma of skin: Secondary | ICD-10-CM | POA: Insufficient documentation

## 2018-03-11 DIAGNOSIS — Z803 Family history of malignant neoplasm of breast: Secondary | ICD-10-CM | POA: Insufficient documentation

## 2018-03-11 DIAGNOSIS — N6021 Fibroadenosis of right breast: Secondary | ICD-10-CM | POA: Diagnosis not present

## 2018-03-11 DIAGNOSIS — N6489 Other specified disorders of breast: Secondary | ICD-10-CM | POA: Diagnosis present

## 2018-03-11 HISTORY — PX: BREAST LUMPECTOMY WITH RADIOACTIVE SEED LOCALIZATION: SHX6424

## 2018-03-11 HISTORY — DX: Disorder of breast, unspecified: N64.9

## 2018-03-11 SURGERY — BREAST LUMPECTOMY WITH RADIOACTIVE SEED LOCALIZATION
Anesthesia: General | Site: Breast | Laterality: Right

## 2018-03-11 MED ORDER — ONDANSETRON HCL 4 MG/2ML IJ SOLN
INTRAMUSCULAR | Status: AC
  Filled 2018-03-11: qty 2

## 2018-03-11 MED ORDER — ACETAMINOPHEN 500 MG PO TABS
1000.0000 mg | ORAL_TABLET | ORAL | Status: AC
  Administered 2018-03-11: 1000 mg via ORAL

## 2018-03-11 MED ORDER — OXYCODONE HCL 5 MG PO TABS
ORAL_TABLET | ORAL | Status: AC
  Filled 2018-03-11: qty 1

## 2018-03-11 MED ORDER — FENTANYL CITRATE (PF) 100 MCG/2ML IJ SOLN
25.0000 ug | INTRAMUSCULAR | Status: DC | PRN
  Administered 2018-03-11: 50 ug via INTRAVENOUS

## 2018-03-11 MED ORDER — 0.9 % SODIUM CHLORIDE (POUR BTL) OPTIME
TOPICAL | Status: DC | PRN
  Administered 2018-03-11: 1000 mL

## 2018-03-11 MED ORDER — GABAPENTIN 300 MG PO CAPS
300.0000 mg | ORAL_CAPSULE | ORAL | Status: AC
  Administered 2018-03-11: 300 mg via ORAL

## 2018-03-11 MED ORDER — ACETAMINOPHEN 500 MG PO TABS
ORAL_TABLET | ORAL | Status: AC
  Administered 2018-03-11: 1000 mg via ORAL
  Filled 2018-03-11: qty 2

## 2018-03-11 MED ORDER — ONDANSETRON HCL 4 MG/2ML IJ SOLN
INTRAMUSCULAR | Status: DC | PRN
  Administered 2018-03-11: 4 mg via INTRAVENOUS

## 2018-03-11 MED ORDER — PROPOFOL 10 MG/ML IV BOLUS
INTRAVENOUS | Status: AC
  Filled 2018-03-11: qty 20

## 2018-03-11 MED ORDER — CELECOXIB 200 MG PO CAPS
ORAL_CAPSULE | ORAL | Status: AC
  Administered 2018-03-11: 200 mg via ORAL
  Filled 2018-03-11: qty 1

## 2018-03-11 MED ORDER — LIDOCAINE 2% (20 MG/ML) 5 ML SYRINGE
INTRAMUSCULAR | Status: DC | PRN
  Administered 2018-03-11: 40 mg via INTRAVENOUS

## 2018-03-11 MED ORDER — DEXAMETHASONE SODIUM PHOSPHATE 10 MG/ML IJ SOLN
INTRAMUSCULAR | Status: DC | PRN
  Administered 2018-03-11: 4 mg via INTRAVENOUS

## 2018-03-11 MED ORDER — PHENYLEPHRINE 40 MCG/ML (10ML) SYRINGE FOR IV PUSH (FOR BLOOD PRESSURE SUPPORT)
PREFILLED_SYRINGE | INTRAVENOUS | Status: DC | PRN
  Administered 2018-03-11: 40 ug via INTRAVENOUS
  Administered 2018-03-11: 20 ug via INTRAVENOUS
  Administered 2018-03-11: 60 ug via INTRAVENOUS
  Administered 2018-03-11 (×2): 40 ug via INTRAVENOUS
  Administered 2018-03-11 (×2): 20 ug via INTRAVENOUS
  Administered 2018-03-11 (×6): 40 ug via INTRAVENOUS

## 2018-03-11 MED ORDER — DEXAMETHASONE SODIUM PHOSPHATE 10 MG/ML IJ SOLN
INTRAMUSCULAR | Status: AC
  Filled 2018-03-11: qty 1

## 2018-03-11 MED ORDER — CHLORHEXIDINE GLUCONATE CLOTH 2 % EX PADS: 6.0000 | MEDICATED_PAD | Freq: Once | CUTANEOUS | Status: DC

## 2018-03-11 MED ORDER — OXYCODONE HCL 5 MG/5ML PO SOLN: 5.0000 mg | Freq: Once | ORAL | Status: AC | PRN

## 2018-03-11 MED ORDER — PROPOFOL 10 MG/ML IV BOLUS
INTRAVENOUS | Status: DC | PRN
  Administered 2018-03-11: 40 mg via INTRAVENOUS
  Administered 2018-03-11: 160 mg via INTRAVENOUS

## 2018-03-11 MED ORDER — MIDAZOLAM HCL 2 MG/2ML IJ SOLN
INTRAMUSCULAR | Status: AC
  Filled 2018-03-11: qty 2

## 2018-03-11 MED ORDER — HYDROCODONE-ACETAMINOPHEN 5-325 MG PO TABS: 1.0000 | ORAL_TABLET | ORAL | 0 refills | Status: DC | PRN

## 2018-03-11 MED ORDER — OXYCODONE HCL 5 MG PO TABS
5.0000 mg | ORAL_TABLET | Freq: Once | ORAL | Status: AC | PRN
  Administered 2018-03-11: 5 mg via ORAL

## 2018-03-11 MED ORDER — MIDAZOLAM HCL 5 MG/5ML IJ SOLN
INTRAMUSCULAR | Status: DC | PRN
  Administered 2018-03-11: 2 mg via INTRAVENOUS

## 2018-03-11 MED ORDER — BUPIVACAINE HCL (PF) 0.25 % IJ SOLN
INTRAMUSCULAR | Status: AC
  Filled 2018-03-11: qty 30

## 2018-03-11 MED ORDER — FENTANYL CITRATE (PF) 100 MCG/2ML IJ SOLN
INTRAMUSCULAR | Status: AC
  Filled 2018-03-11: qty 2

## 2018-03-11 MED ORDER — FENTANYL CITRATE (PF) 250 MCG/5ML IJ SOLN
INTRAMUSCULAR | Status: DC | PRN
  Administered 2018-03-11 (×2): 50 ug via INTRAVENOUS

## 2018-03-11 MED ORDER — FENTANYL CITRATE (PF) 250 MCG/5ML IJ SOLN
INTRAMUSCULAR | Status: AC
  Filled 2018-03-11: qty 5

## 2018-03-11 MED ORDER — ONDANSETRON HCL 4 MG/2ML IJ SOLN: 4.0000 mg | Freq: Once | INTRAMUSCULAR | Status: DC | PRN

## 2018-03-11 MED ORDER — CEFAZOLIN SODIUM-DEXTROSE 2-4 GM/100ML-% IV SOLN
INTRAVENOUS | Status: AC
  Filled 2018-03-11: qty 100

## 2018-03-11 MED ORDER — LACTATED RINGERS IV SOLN
INTRAVENOUS | Status: DC
  Administered 2018-03-11: 10:00:00 via INTRAVENOUS

## 2018-03-11 MED ORDER — CELECOXIB 200 MG PO CAPS
200.0000 mg | ORAL_CAPSULE | ORAL | Status: AC
  Administered 2018-03-11: 200 mg via ORAL

## 2018-03-11 MED ORDER — HYDROCODONE-ACETAMINOPHEN 5-325 MG PO TABS: 1.0000 | ORAL_TABLET | Freq: Four times a day (QID) | ORAL | 0 refills | Status: DC | PRN

## 2018-03-11 MED ORDER — BUPIVACAINE HCL 0.25 % IJ SOLN
INTRAMUSCULAR | Status: DC | PRN
  Administered 2018-03-11: 20 mL

## 2018-03-11 MED ORDER — CEFAZOLIN SODIUM-DEXTROSE 2-4 GM/100ML-% IV SOLN
2.0000 g | INTRAVENOUS | Status: AC
  Administered 2018-03-11: 2 g via INTRAVENOUS

## 2018-03-11 MED ORDER — GABAPENTIN 300 MG PO CAPS
ORAL_CAPSULE | ORAL | Status: AC
  Administered 2018-03-11: 300 mg via ORAL
  Filled 2018-03-11: qty 1

## 2018-03-11 SURGICAL SUPPLY — 35 items
ADH SKN CLS APL DERMABOND .7 (GAUZE/BANDAGES/DRESSINGS) ×1
APPLIER CLIP 9.375 MED OPEN (MISCELLANEOUS) ×3
APR CLP MED 9.3 20 MLT OPN (MISCELLANEOUS) ×1
BINDER BREAST LRG (GAUZE/BANDAGES/DRESSINGS) IMPLANT
BINDER BREAST XLRG (GAUZE/BANDAGES/DRESSINGS) ×2 IMPLANT
CANISTER SUCT 3000ML PPV (MISCELLANEOUS) ×3 IMPLANT
CHLORAPREP W/TINT 26ML (MISCELLANEOUS) ×3 IMPLANT
CLIP APPLIE 9.375 MED OPEN (MISCELLANEOUS) IMPLANT
COVER PROBE W GEL 5X96 (DRAPES) ×3 IMPLANT
COVER SURGICAL LIGHT HANDLE (MISCELLANEOUS) ×3 IMPLANT
COVER WAND RF STERILE (DRAPES) IMPLANT
DERMABOND ADVANCED (GAUZE/BANDAGES/DRESSINGS) ×2
DERMABOND ADVANCED .7 DNX12 (GAUZE/BANDAGES/DRESSINGS) ×1 IMPLANT
DEVICE DUBIN SPECIMEN MAMMOGRA (MISCELLANEOUS) ×3 IMPLANT
DRAPE CHEST BREAST 15X10 FENES (DRAPES) ×3 IMPLANT
ELECT COATED BLADE 2.86 ST (ELECTRODE) ×3 IMPLANT
ELECT REM PT RETURN 9FT ADLT (ELECTROSURGICAL) ×3
ELECTRODE REM PT RTRN 9FT ADLT (ELECTROSURGICAL) ×1 IMPLANT
GLOVE BIO SURGEON STRL SZ7.5 (GLOVE) ×6 IMPLANT
GOWN STRL REUS W/ TWL LRG LVL3 (GOWN DISPOSABLE) ×2 IMPLANT
GOWN STRL REUS W/TWL LRG LVL3 (GOWN DISPOSABLE) ×6
KIT BASIN OR (CUSTOM PROCEDURE TRAY) ×3 IMPLANT
KIT MARKER MARGIN INK (KITS) ×3 IMPLANT
LIGHT WAVEGUIDE WIDE FLAT (MISCELLANEOUS) IMPLANT
NDL HYPO 25GX1X1/2 BEV (NEEDLE) ×1 IMPLANT
NEEDLE HYPO 25GX1X1/2 BEV (NEEDLE) ×3 IMPLANT
NS IRRIG 1000ML POUR BTL (IV SOLUTION) ×3 IMPLANT
PACK GENERAL/GYN (CUSTOM PROCEDURE TRAY) ×3 IMPLANT
PAD ABD 8X10 STRL (GAUZE/BANDAGES/DRESSINGS) ×2 IMPLANT
SUT MNCRL AB 4-0 PS2 18 (SUTURE) ×3 IMPLANT
SUT SILK 2 0 SH (SUTURE) ×2 IMPLANT
SUT VIC AB 3-0 SH 18 (SUTURE) ×3 IMPLANT
SYR CONTROL 10ML LL (SYRINGE) ×3 IMPLANT
TOWEL OR 17X24 6PK STRL BLUE (TOWEL DISPOSABLE) ×3 IMPLANT
TOWEL OR 17X26 10 PK STRL BLUE (TOWEL DISPOSABLE) ×3 IMPLANT

## 2018-03-11 NOTE — Interval H&P Note (Signed)
History and Physical Interval Note:  03/11/2018 11:03 AM  Claudia Mcintyre  has presented today for surgery, with the diagnosis of RIGHT BREAST COMPLEX SCLEROSING LESION  The various methods of treatment have been discussed with the patient and family. After consideration of risks, benefits and other options for treatment, the patient has consented to  Procedure(s): RIGHT BREAST LUMPECTOMY WITH RADIOACTIVE SEED LOCALIZATION (Right) as a surgical intervention .  The patient's history has been reviewed, patient examined, no change in status, stable for surgery.  I have reviewed the patient's chart and labs.  Questions were answered to the patient's satisfaction.     Autumn Messing III

## 2018-03-11 NOTE — H&P (Signed)
Claudia Mcintyre  Location: [facility] Patient #: [id number] DOB: [birth year] Divorced / Language: Claudia Mcintyre / Race: White Female   History of Present Illness  The patient is a 43 year old female who presents with a breast mass. We are asked to see the patient in consultation by Dr. Shea Mcintyre to evaluate her for a complex sclerosing lesion of the right breast. The patient is a 43 year old white female who recently went for a routine screening mammogram. At that time she was found to have an area of distortion in the upper outer right breast. This was biopsied and came back as a complex sclerosing lesion. She denies any breast pain or discharge from the nipple. She does not smoke. She does have a family history of 2 maternal aunts with breast cancer.   Past Surgical History  Breast Biopsy  Right. Gallbladder Surgery - Laparoscopic  Hemorrhoidectomy  Hysterectomy (not due to cancer) - Partial  Knee Surgery  Bilateral. Oral Surgery  Spinal Surgery - Lower Back   Diagnostic Studies History ( Colonoscopy  never Mammogram  within last year Pap Smear  1-5 years ago  Allergies  No Known Allergies  No Known Drug Allergies  Allergies Reconciled   Medication History Amoxicillin-Pot Clavulanate (875-125MG  Tablet, Oral) Active. Medications Reconciled  Social History  Alcohol use  Occasional alcohol use. Caffeine use  Carbonated beverages. No drug use  Tobacco use  Never smoker.  Family History  Breast Cancer  Family Members In General. Cerebrovascular Accident  Mother. Depression  Mother. Diabetes Mellitus  Father, Mother. Heart Disease  Brother, Father, Mother. Heart disease in female family member before age 93  Heart disease in female family member before age 46  Hypertension  Father, Mother. Melanoma  Family Members In General, Mother. Respiratory Condition  Brother.  Pregnancy / Birth History Age at menarche  71  years. Contraceptive History  Oral contraceptives. Gravida  2 Length (months) of breastfeeding  7-12 Maternal age  76-20 Para  2  Other Problems  Arthritis  Cholelithiasis  Hemorrhoids  Kidney Stone  Melanoma     Review of Systems  General Not Present- Appetite Loss, Chills, Fatigue, Fever, Night Sweats, Weight Gain and Weight Loss. Skin Not Present- Change in Wart/Mole, Dryness, Hives, Jaundice, New Lesions, Non-Healing Wounds, Rash and Ulcer. HEENT Present- Hoarseness, Seasonal Allergies and Wears glasses/contact lenses. Not Present- Earache, Hearing Loss, Nose Bleed, Oral Ulcers, Ringing in the Ears, Sinus Pain, Sore Throat, Visual Disturbances and Yellow Eyes. Respiratory Not Present- Bloody sputum, Chronic Cough, Difficulty Breathing, Snoring and Wheezing. Breast Present- Breast Mass. Not Present- Breast Pain, Nipple Discharge and Skin Changes. Cardiovascular Not Present- Chest Pain, Difficulty Breathing Lying Down, Leg Cramps, Palpitations, Rapid Heart Rate, Shortness of Breath and Swelling of Extremities. Gastrointestinal Not Present- Abdominal Pain, Bloating, Bloody Stool, Change in Bowel Habits, Chronic diarrhea, Constipation, Difficulty Swallowing, Excessive gas, Gets full quickly at meals, Hemorrhoids, Indigestion, Nausea, Rectal Pain and Vomiting. Female Genitourinary Not Present- Frequency, Nocturia, Painful Urination, Pelvic Pain and Urgency. Musculoskeletal Not Present- Back Pain, Joint Pain, Joint Stiffness, Muscle Pain, Muscle Weakness and Swelling of Extremities. Neurological Not Present- Decreased Memory, Fainting, Headaches, Numbness, Seizures, Tingling, Tremor, Trouble walking and Weakness. Psychiatric Not Present- Anxiety, Bipolar, Change in Sleep Pattern, Depression, Fearful and Frequent crying. Endocrine Not Present- Cold Intolerance, Excessive Hunger, Hair Changes, Heat Intolerance, Hot flashes and New Diabetes. Hematology Not Present- Blood Thinners,  Easy Bruising, Excessive bleeding, Gland problems, HIV and Persistent Infections.  Vitals Weight: 182.13 lb Height: 63in  Body Surface Area: 1.86 m Body Mass Index: 32.26 kg/m  Temp.: 97.82F(Temporal)  Pulse: 98 (Regular)  P.OX: 100% (Room air) BP: 166/88 (Sitting, Left Arm, Standard)       Physical Exam  General Mental Status-Alert. General Appearance-Consistent with stated age. Hydration-Well hydrated. Voice-Normal.  Head and Neck Head-normocephalic, atraumatic with no lesions or palpable masses. Trachea-midline. Thyroid Gland Characteristics - normal size and consistency.  Eye Eyeball - Bilateral-Extraocular movements intact. Sclera/Conjunctiva - Bilateral-No scleral icterus.  Chest and Lung Exam Chest and lung exam reveals -quiet, even and easy respiratory effort with no use of accessory muscles and on auscultation, normal breath sounds, no adventitious sounds and normal vocal resonance. Inspection Chest Wall - Normal. Back - normal.  Breast Note: There is no palpable mass in either breast other than what feels like a small bruise in the upper outer quadrant of the right breast but still fairly central. There is no palpable axillary, supraclavicular, or cervical lymphadenopathy.   Cardiovascular Cardiovascular examination reveals -normal heart sounds, regular rate and rhythm with no murmurs and normal pedal pulses bilaterally.  Abdomen Inspection Inspection of the abdomen reveals - No Hernias. Skin - Scar - no surgical scars. Palpation/Percussion Palpation and Percussion of the abdomen reveal - Soft, Non Tender, No Rebound tenderness, No Rigidity (guarding) and No hepatosplenomegaly. Auscultation Auscultation of the abdomen reveals - Bowel sounds normal.  Neurologic Neurologic evaluation reveals -alert and oriented x 3 with no impairment of recent or remote memory. Mental Status-Normal.  Musculoskeletal Normal Exam -  Left-Upper Extremity Strength Normal and Lower Extremity Strength Normal. Normal Exam - Right-Upper Extremity Strength Normal and Lower Extremity Strength Normal.  Lymphatic Head & Neck  General Head & Neck Lymphatics: Bilateral - Description - Normal. Axillary  General Axillary Region: Bilateral - Description - Normal. Tenderness - Non Tender. Femoral & Inguinal  Generalized Femoral & Inguinal Lymphatics: Bilateral - Description - Normal. Tenderness - Non Tender.    Assessment & Plan  SCLEROSING ADENOSIS OF BREAST, RIGHT (N60.21) Impression: The patient appears to have a complex sclerosing lesion in the upper outer right breast. Because of the distortion and because of the risk of missing something more important, I think it would be reasonable to remove this area. She would also like to have this done. I have discussed with her in detail the risks and benefits of the operation as well as some of the technical aspects and she understands and wishes to proceed. I will plan for a right breast radioactive seed localized lumpectomy. Current Plans Pt Education - Breast Diseases: discussed with patient and provided information.

## 2018-03-11 NOTE — Anesthesia Procedure Notes (Signed)
Procedure Name: LMA Insertion Date/Time: 03/11/2018 11:17 AM Performed by: Wilburn Cornelia, CRNA Pre-anesthesia Checklist: Patient identified, Emergency Drugs available, Suction available, Patient being monitored and Timeout performed Patient Re-evaluated:Patient Re-evaluated prior to induction Oxygen Delivery Method: Circle system utilized Preoxygenation: Pre-oxygenation with 100% oxygen Induction Type: IV induction Ventilation: Mask ventilation without difficulty LMA: LMA inserted LMA Size: 4.0 Number of attempts: 1 Placement Confirmation: positive ETCO2,  CO2 detector and breath sounds checked- equal and bilateral Tube secured with: Tape Dental Injury: Teeth and Oropharynx as per pre-operative assessment

## 2018-03-11 NOTE — Op Note (Signed)
03/11/2018  12:15 PM  PATIENT:  Claudia Mcintyre  43 y.o. female  PRE-OPERATIVE DIAGNOSIS:  RIGHT BREAST COMPLEX SCLEROSING LESION  POST-OPERATIVE DIAGNOSIS:  RIGHT BREAST COMPLEX SCLEROSING LESION  PROCEDURE:  Procedure(s): RIGHT BREAST LUMPECTOMY WITH RADIOACTIVE SEED LOCALIZATION (Right)  SURGEON:  Surgeon(s) and Role:    * Jovita Kussmaul, MD - Primary  PHYSICIAN ASSISTANT:   ASSISTANTS: none   ANESTHESIA:   local and general  EBL:  minimal   BLOOD ADMINISTERED:none  DRAINS: none   LOCAL MEDICATIONS USED:  MARCAINE     SPECIMEN:  Source of Specimen:  right breast tissue  DISPOSITION OF SPECIMEN:  PATHOLOGY  COUNTS:  YES  TOURNIQUET:  * No tourniquets in log *  DICTATION: .Dragon Dictation   After informed consent was obtained the patient was brought to the operating room and placed in the supine position on the operating table.  After adequate induction of general anesthesia the patient's right breast was prepped with ChloraPrep, allowed to dry, and draped in usual sterile manner.  An appropriate timeout was performed.  The neoprobe was set to I-125 and the radioactive seed was readily identified in the upper outer quadrant.  The area around this was infiltrated with quarter percent Marcaine.  A curvilinear incision was made along the upper outer edge of the areola.  The incision was carried through the skin and subcutaneous tissue sharply with the electrocautery.  Dissection was then carried towards the radioactive seed under the direction of the neoprobe sharply with the electrocautery.  Once I more closely approached the radioactive seed I then removed a circular portion of breast tissue sharply around the radioactive seed while checking the area of radioactivity frequently with the neoprobe.  Once the specimen was removed it was oriented with the appropriate paint colors.  A specimen radiograph was obtained that showed the clip and seed to be within the specimen.  The  specimen was then sent to pathology for further evaluation.  Hemostasis was achieved using the Bovie electrocautery.  The wound was then infiltrated with more quarter percent Marcaine.  The deep layer of the wound was then closed with layers of interrupted 3-0 Vicryl stitches.  The skin was then closed with interrupted 4-0 Monocryl subcuticular stitches.  Dermabond dressings were applied.  The patient tolerated the procedure well.  At the end of the case all needle sponge and instrument counts were correct.  The patient was then awakened and taken to recovery in stable condition.  PLAN OF CARE: Discharge to home after PACU  PATIENT DISPOSITION:  PACU - hemodynamically stable.   Delay start of Pharmacological VTE agent (>24hrs) due to surgical blood loss or risk of bleeding: not applicable

## 2018-03-11 NOTE — Anesthesia Preprocedure Evaluation (Addendum)

## 2018-03-11 NOTE — Anesthesia Postprocedure Evaluation (Signed)
Anesthesia Post Note  Patient: Claudia Mcintyre  Procedure(s) Performed: RIGHT BREAST LUMPECTOMY WITH RADIOACTIVE SEED LOCALIZATION (Right Breast)     Patient location during evaluation: PACU Anesthesia Type: General Level of consciousness: awake and alert Pain management: pain level controlled Vital Signs Assessment: post-procedure vital signs reviewed and stable Respiratory status: spontaneous breathing, nonlabored ventilation, respiratory function stable and patient connected to nasal cannula oxygen Cardiovascular status: blood pressure returned to baseline and stable Postop Assessment: no apparent nausea or vomiting Anesthetic complications: no    Last Vitals:  Vitals:   03/11/18 1300 03/11/18 1303  BP: (!) 110/56 112/60  Pulse: 89 94  Resp: 15 (!) 22  Temp: (!) 36.3 C   SpO2: 100% 98%    Last Pain:  Vitals:   03/11/18 1303  TempSrc:   PainSc: 3                  Reno Clasby COKER

## 2018-03-11 NOTE — Transfer of Care (Signed)
Immediate Anesthesia Transfer of Care Note  Patient: Claudia Mcintyre  Procedure(s) Performed: RIGHT BREAST LUMPECTOMY WITH RADIOACTIVE SEED LOCALIZATION (Right Breast)  Patient Location: PACU  Anesthesia Type:General  Level of Consciousness: awake  Airway & Oxygen Therapy: Patient Spontanous Breathing and Patient connected to nasal cannula oxygen  Post-op Assessment: Report given to RN and Post -op Vital signs reviewed and stable  Post vital signs: Reviewed and stable  Last Vitals:  Vitals Value Taken Time  BP 119/75 03/11/2018 12:25 PM  Temp    Pulse    Resp 26 03/11/2018 12:26 PM  SpO2    Vitals shown include unvalidated device data.  Last Pain:  Vitals:   03/11/18 1006  TempSrc:   PainSc: 0-No pain         Complications: No apparent anesthesia complications

## 2018-03-12 ENCOUNTER — Encounter (HOSPITAL_COMMUNITY): Payer: Self-pay | Admitting: General Surgery

## 2019-08-11 ENCOUNTER — Ambulatory Visit
Admission: RE | Admit: 2019-08-11 | Discharge: 2019-08-11 | Disposition: A | Discharge status: Home / Self Care | Payer: 59 | Source: Ambulatory Visit

## 2019-08-11 ENCOUNTER — Other Ambulatory Visit: Payer: Self-pay

## 2019-08-11 VITALS — BP 119/78 | HR 89 | Temp 98.8°F | Resp 20

## 2019-08-11 DIAGNOSIS — J069 Acute upper respiratory infection, unspecified: Secondary | ICD-10-CM | POA: Diagnosis not present

## 2019-08-11 DIAGNOSIS — H60331 Swimmer's ear, right ear: Secondary | ICD-10-CM

## 2019-08-11 DIAGNOSIS — Z20822 Contact with and (suspected) exposure to covid-19: Secondary | ICD-10-CM

## 2019-08-11 MED ORDER — BENZONATATE 100 MG PO CAPS: 100.0000 mg | ORAL_CAPSULE | Freq: Three times a day (TID) | ORAL | 0 refills | Status: AC

## 2019-08-11 MED ORDER — CIPROFLOXACIN-DEXAMETHASONE 0.3-0.1 % OT SUSP: 4.0000 [drp] | Freq: Two times a day (BID) | OTIC | 0 refills | Status: AC

## 2019-08-11 NOTE — Discharge Instructions (Signed)
COVID testing ordered.  It will take between 2-5 days for test results.  Someone will contact you regarding abnormal results.    In the meantime: You should remain isolated in your home for 10 days from symptom onset AND greater than 72 hours after symptoms resolution (absence of fever without the use of fever-reducing medication and improvement in respiratory symptoms), whichever is longer Get plenty of rest and push fluids Use OTC zyrtec for nasal congestion, runny nose, and/or sore throat Use OTC flonase for nasal congestion and runny nose Use medications daily for symptom relief Use OTC medications like ibuprofen or tylenol as needed fever or pain Call or go to the ED if you have any new or worsening symptoms such as fever, cough, shortness of breath, chest tightness, chest pain, turning blue, changes in mental status, etc...  

## 2019-08-11 NOTE — ED Triage Notes (Signed)
Pt presents with right ear pain, sore throat and headache x 1 day. Denies fever, chills.  Tylenol sinus pressure gives somewhat relief.

## 2019-08-11 NOTE — ED Provider Notes (Signed)
[facility]   [id number] 08/11/19 Arrival Time: 4098   CC: COVID symptoms  SUBJECTIVE: History from: patient.  Claudia Mcintyre is a 44 y.o. female who presents with right ear pain, sore throat, cough, and headache x 1 day.  Denies sick exposure to COVID, flu or strep.  Has tried OTC medications without relief.  Symptoms are made worse at night.  Reports previous symptoms in the past with ear infection.   Denies fever, chills, SOB, wheezing, chest pain, nausea, changes in bowel or bladder habits.     ROS: As per HPI.  All other pertinent ROS negative.     Past Medical History:  Diagnosis Date  . Arthritis   . Breast lesion    Right-sclerosing  . Cancer (HCC)    Skin Cancer  squamous cell  . History of kidney stones   . Kidney stone   . Numbness and tingling in left arm    Past Surgical History:  Procedure Laterality Date  . ABDOMINAL HYSTERECTOMY    . BREAST LUMPECTOMY WITH RADIOACTIVE SEED LOCALIZATION Right 03/11/2018   Procedure: RIGHT BREAST LUMPECTOMY WITH RADIOACTIVE SEED LOCALIZATION;  Surgeon: Jovita Kussmaul, MD;  Location: [facility];  Service: General;  Laterality: Right;  . CHOLECYSTECTOMY    . CYSTOSCOPY WITH RETROGRADE PYELOGRAM, URETEROSCOPY AND STENT PLACEMENT Left 10/22/2013   Procedure: CYSTOSCOPY WITH RETROGRADE PYELOGRAM, STONE BASKETRY;  Surgeon: Jorja Loa, MD;  Location: [facility];  Service: Urology;  Laterality: Left;  . HEMORRHOID SURGERY N/A 06/25/2012   Procedure: HEMORRHOIDECTOMY;  Surgeon: Donato Heinz, MD;  Location: [facility];  Service: General;  Laterality: N/A;  . KNEE ARTHROSCOPY    . RECTOCELE REPAIR N/A 06/25/2012   Procedure: POSTERIOR REPAIR (RECTOCELE);  Surgeon: Jonnie Kind, MD;  Location: [facility];  Service: Gynecology;  Laterality: N/A;  . SPINE SURGERY    . TUBAL LIGATION    . VAGINAL HYSTERECTOMY N/A 06/25/2012   Procedure: HYSTERECTOMY VAGINAL;  Surgeon: Jonnie Kind, MD;  Location: [facility];   Service: Gynecology;  Laterality: N/A;   No Known Allergies No current facility-administered medications on file prior to encounter.   Current Outpatient Medications on File Prior to Encounter  Medication Sig Dispense Refill  . pseudoephedrine-acetaminophen (TYLENOL SINUS) 30-500 MG TABS tablet Take 1 tablet by mouth every 4 (four) hours as needed.     Social History   Socioeconomic History  . Marital status: Divorced    Spouse name: Not on file  . Number of children: Not on file  . Years of education: Not on file  . Highest education level: Not on file  Occupational History  . Not on file  Tobacco Use  . Smoking status: Never Smoker  . Smokeless tobacco: Never Used  Vaping Use  . Vaping Use: Never used  Substance and Sexual Activity  . Alcohol use: Yes    Alcohol/week: 1.0 standard drink    Types: 1 Glasses of wine per week    Comment: "occasionally"  . Drug use: No  . Sexual activity: Yes    Birth control/protection: Surgical  Other Topics Concern  . Not on file  Social History Narrative  . Not on file   Social Determinants of Health   Financial Resource Strain:   . Difficulty of Paying Living Expenses:   Food Insecurity:   . Worried About Charity fundraiser in the Last Year:   . Arboriculturist in the Last Year:   [employer]  Needs:   . Lack of Transportation (Medical):   Marland Kitchen Lack of Transportation (Non-Medical):   Physical Activity:   . Days of Exercise per Week:   . Minutes of Exercise per Session:   Stress:   . Feeling of Stress :   Social Connections:   . Frequency of Communication with Friends and Family:   . Frequency of Social Gatherings with Friends and Family:   . Attends Religious Services:   . Active Member of Clubs or Organizations:   . Attends Archivist Meetings:   Marland Kitchen Marital Status:   Intimate Partner Violence:   . Fear of Current or Ex-Partner:   . Emotionally Abused:   Marland Kitchen Physically Abused:   . Sexually Abused:    Family  History  Problem Relation Age of Onset  . Heart disease Mother   . Hypertension Mother   . Cancer Mother   . Diabetes Mother   . Stroke Mother   . Heart disease Father   . Hypertension Father   . Heart disease Maternal Grandfather   . Heart disease Paternal Grandfather     OBJECTIVE:  Vitals:   08/11/19 1516  BP: 119/78  Pulse: 89  Resp: 20  Temp: 98.8 F (37.1 C)  TempSrc: Oral  SpO2: 98%     General appearance: alert; appears fatigued, but nontoxic; speaking in full sentences and tolerating own secretions HEENT: NCAT; Ears LT EAC clear, RT EAC mildly erythematous and swollen, TMs pearly gray; Eyes: PERRL.  EOM grossly intact. Nose: nares patent without rhinorrhea, Throat: oropharynx clear, tonsils non erythematous or enlarged, uvula midline  Neck: supple without LAD Lungs: unlabored respirations, symmetrical air entry; cough: mild; no respiratory distress; CTAB Heart: regular rate and rhythm.  Skin: warm and dry Psychological: alert and cooperative; normal mood and affect  ASSESSMENT & PLAN:  1. Viral URI with cough   2. Suspected COVID-19 virus infection   3. Acute swimmer's ear of right side     Meds ordered this encounter  Medications  . benzonatate (TESSALON) 100 MG capsule    Sig: Take 1 capsule (100 mg total) by mouth every 8 (eight) hours.    Dispense:  21 capsule    Refill:  0    Order Specific Question:   Supervising Provider    Answer:   Raylene Everts [[id number]]  . ciprofloxacin-dexamethasone (CIPRODEX) OTIC suspension    Sig: Place 4 drops into the right ear 2 (two) times daily for 7 days.    Dispense:  7.5 mL    Refill:  0    Order Specific Question:   Supervising Provider    Answer:   Raylene Everts [[id number]]   COVID testing ordered.  It will take between 2-5 days for test results.  Someone will contact you regarding abnormal results.    In the meantime: You should remain isolated in your home for 10 days from symptom onset AND greater  than 72 hours after symptoms resolution (absence of fever without the use of fever-reducing medication and improvement in respiratory symptoms), whichever is longer Get plenty of rest and push fluids Use OTC zyrtec for nasal congestion, runny nose, and/or sore throat Use OTC flonase for nasal congestion and runny nose Use medications daily for symptom relief Use OTC medications like ibuprofen or tylenol as needed fever or pain Call or go to the ED if you have any new or worsening symptoms such as fever, cough, shortness of breath, chest tightness, chest pain, turning blue, changes  in mental status, etc...   Ear drops for RT external ear infection  Reviewed expectations re: course of current medical issues. Questions answered. Outlined signs and symptoms indicating need for more acute intervention. Patient verbalized understanding. After Visit Summary given.         Lestine Box, PA-C 08/11/19 1549

## 2019-08-12 LAB — NOVEL CORONAVIRUS, NAA: SARS-CoV-2, NAA: NOT DETECTED

## 2019-08-12 LAB — SARS-COV-2, NAA 2 DAY TAT

## 2023-09-04 ENCOUNTER — Ambulatory Visit

## 2023-10-23 ENCOUNTER — Encounter (INDEPENDENT_AMBULATORY_CARE_PROVIDER_SITE_OTHER): Payer: Self-pay | Admitting: *Deleted

## 2023-10-23 ENCOUNTER — Other Ambulatory Visit: Payer: Self-pay | Admitting: Internal Medicine

## 2023-10-23 DIAGNOSIS — Z1231 Encounter for screening mammogram for malignant neoplasm of breast: Secondary | ICD-10-CM

## 2023-11-06 ENCOUNTER — Ambulatory Visit
Admission: RE | Admit: 2023-11-06 | Discharge: 2023-11-06 | Disposition: A | Discharge status: Home / Self Care | Source: Ambulatory Visit | Attending: Internal Medicine | Admitting: Internal Medicine

## 2023-11-06 DIAGNOSIS — Z1231 Encounter for screening mammogram for malignant neoplasm of breast: Secondary | ICD-10-CM
